# Patient Record
Sex: Male | Born: 1980
Health system: Southern US, Community
[De-identification: ages and names within clinical notes are randomized; demographics above are authoritative.]

## PROBLEM LIST (undated history)

## (undated) DIAGNOSIS — E559 Vitamin D deficiency, unspecified: Secondary | ICD-10-CM

## (undated) DIAGNOSIS — E7800 Pure hypercholesterolemia, unspecified: Secondary | ICD-10-CM

## (undated) DIAGNOSIS — F419 Anxiety disorder, unspecified: Secondary | ICD-10-CM

## (undated) HISTORY — DX: Pure hypercholesterolemia, unspecified: E78.00

## (undated) HISTORY — PX: APPENDECTOMY: SHX54

## (undated) HISTORY — PX: VASECTOMY: SHX75

## (undated) HISTORY — PX: CARDIAC SURGERY: SHX584

## (undated) HISTORY — DX: Vitamin D deficiency, unspecified: E55.9

---

## 2009-10-14 ENCOUNTER — Encounter: Admission: RE | Admit: 2009-10-14 | Discharge: 2009-10-14 | Payer: Self-pay | Admitting: Emergency Medicine

## 2012-04-30 ENCOUNTER — Ambulatory Visit (HOSPITAL_COMMUNITY)
Admission: RE | Admit: 2012-04-30 | Discharge: 2012-04-30 | Disposition: A | Discharge status: Home / Self Care | Payer: BC Managed Care – PPO | Source: Ambulatory Visit | Attending: Obstetrics and Gynecology | Admitting: Obstetrics and Gynecology

## 2012-04-30 DIAGNOSIS — Z31448 Encounter for other genetic testing of male for procreative management: Secondary | ICD-10-CM | POA: Insufficient documentation

## 2012-05-02 ENCOUNTER — Other Ambulatory Visit: Payer: Self-pay

## 2012-05-05 ENCOUNTER — Emergency Department (HOSPITAL_COMMUNITY)
Admission: EM | Admit: 2012-05-05 | Discharge: 2012-05-05 | Disposition: A | Discharge status: Home / Self Care | Payer: BC Managed Care – PPO | Attending: Emergency Medicine | Admitting: Emergency Medicine

## 2012-05-05 ENCOUNTER — Ambulatory Visit: Payer: BC Managed Care – PPO

## 2012-05-05 ENCOUNTER — Ambulatory Visit: Payer: BC Managed Care – PPO | Admitting: Emergency Medicine

## 2012-05-05 ENCOUNTER — Emergency Department (HOSPITAL_COMMUNITY): Payer: BC Managed Care – PPO

## 2012-05-05 ENCOUNTER — Encounter (HOSPITAL_COMMUNITY): Payer: Self-pay | Admitting: Emergency Medicine

## 2012-05-05 VITALS — BP 138/82 | HR 76 | Temp 97.9°F | Resp 18 | Ht 68.5 in | Wt 186.0 lb

## 2012-05-05 DIAGNOSIS — R209 Unspecified disturbances of skin sensation: Secondary | ICD-10-CM | POA: Insufficient documentation

## 2012-05-05 DIAGNOSIS — R079 Chest pain, unspecified: Secondary | ICD-10-CM

## 2012-05-05 DIAGNOSIS — Z8659 Personal history of other mental and behavioral disorders: Secondary | ICD-10-CM | POA: Insufficient documentation

## 2012-05-05 DIAGNOSIS — R071 Chest pain on breathing: Secondary | ICD-10-CM | POA: Insufficient documentation

## 2012-05-05 HISTORY — DX: Anxiety disorder, unspecified: F41.9

## 2012-05-05 LAB — BASIC METABOLIC PANEL
CO2: 28 mEq/L (ref 19–32)
Calcium: 9.3 mg/dL (ref 8.4–10.5)
Chloride: 105 mEq/L (ref 96–112)
Potassium: 3.9 mEq/L (ref 3.5–5.1)
Sodium: 141 mEq/L (ref 135–145)

## 2012-05-05 LAB — CBC
Platelets: 180 10*3/uL (ref 150–400)
RBC: 4.51 MIL/uL (ref 4.22–5.81)
WBC: 5.6 10*3/uL (ref 4.0–10.5)

## 2012-05-05 LAB — POCT I-STAT TROPONIN I: Troponin i, poc: 0 ng/mL (ref 0.00–0.08)

## 2012-05-05 IMAGING — CR DG CHEST 1V PORT
1 series · 1 of 1 positions shown · non-contrast
Comparison: Chest CT [DATE]

CLINICAL DATA: Chest pain.

PORTABLE CHEST - 1 VIEW

[AP]
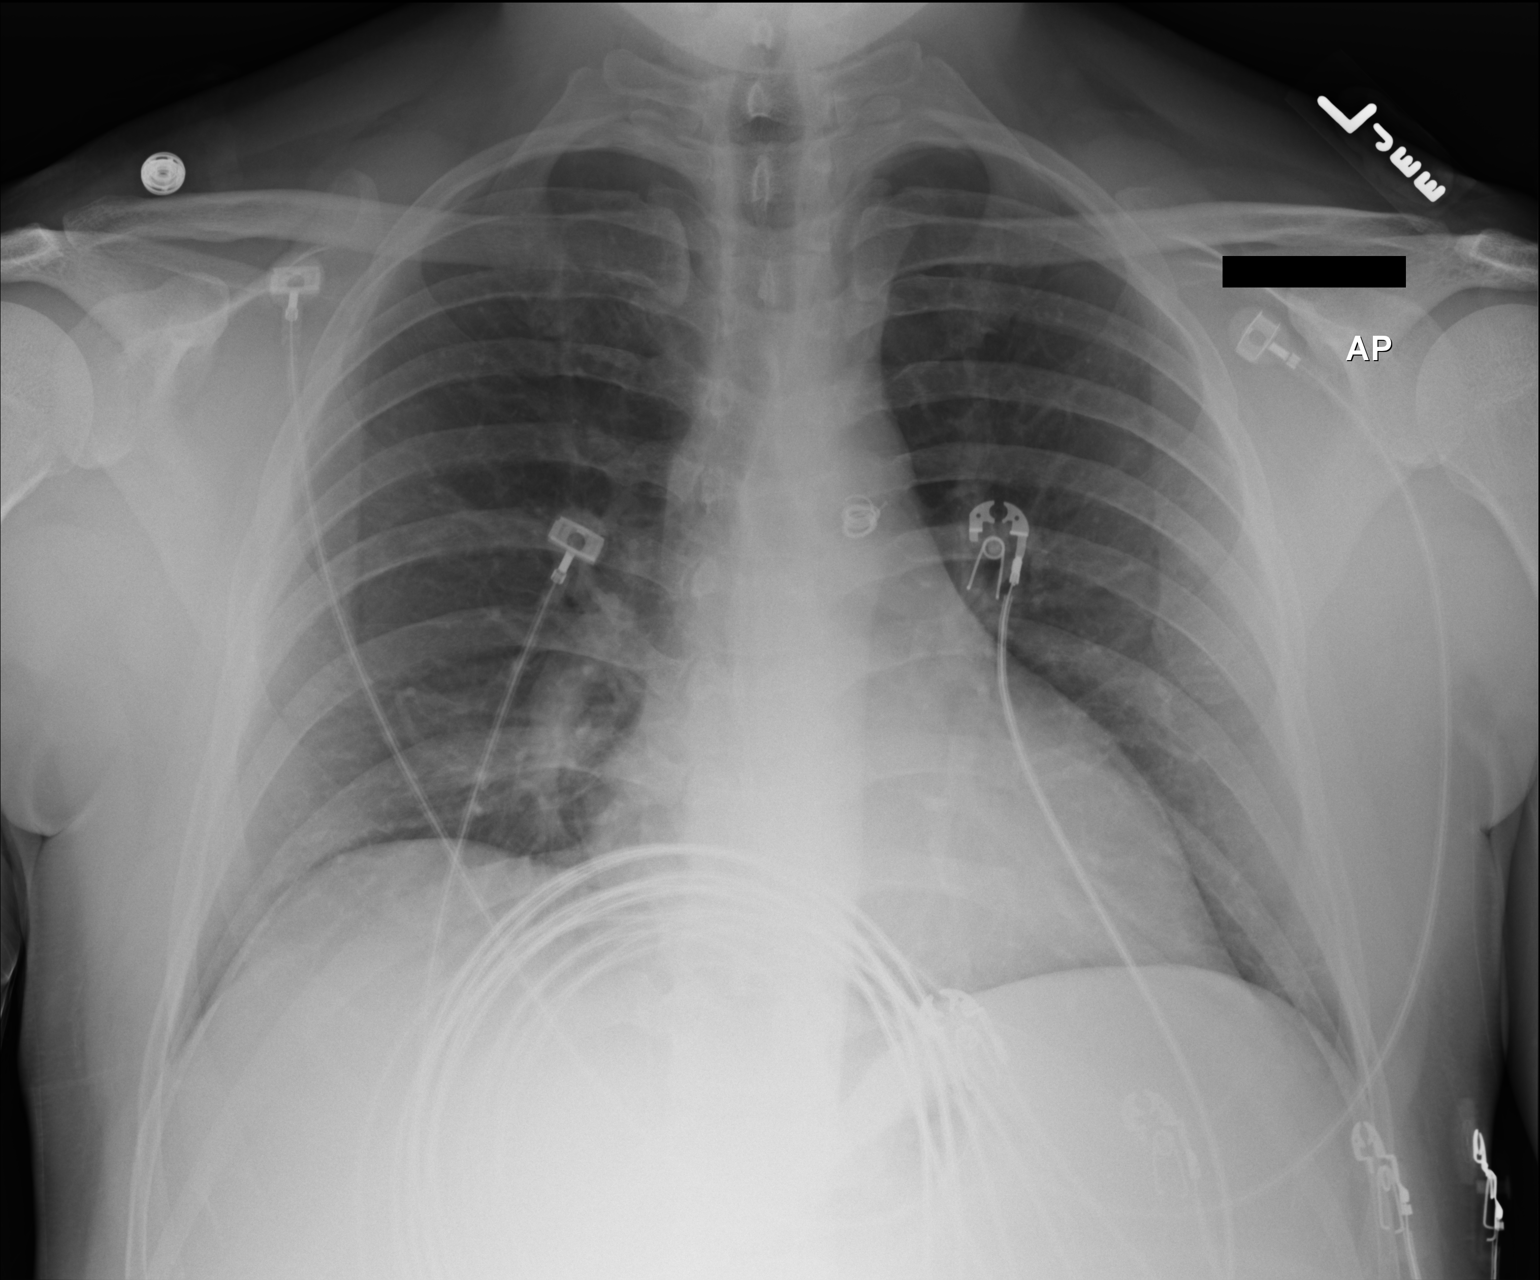

[1 of 1 positions shown; findings below may reference images not displayed]

FINDINGS: Single view of the chest demonstrates mild elevation of
the right hemidiaphragm which may be chronic. Heart and mediastinum
are within normal limits.  The lungs are clear without airspace
disease or edema.  Bony thorax is intact.
IMPRESSION: No acute chest findings.

## 2012-05-05 MED ORDER — NITROGLYCERIN 0.4 MG SL SUBL: 0.4000 mg | SUBLINGUAL_TABLET | Freq: Once | SUBLINGUAL | Status: DC

## 2012-05-05 MED ORDER — NITROGLYCERIN 0.3 MG SL SUBL
0.4000 mg | SUBLINGUAL_TABLET | Freq: Once | SUBLINGUAL | Status: AC
  Administered 2012-05-05: 0.3 mg via SUBLINGUAL

## 2012-05-05 NOTE — ED Notes (Signed)
PER EMS: pt from urgent care; pt c/o chest pain at urgent care; pt given 1 nitro and 324 ASA; pt denies chest pain at this time; BP 138/82 pulse 76 RR 16; 12 lead unremarkable; pt has hx of anxiety, pt has been off medication for anxiety for about 3 months. Pt c/o left arm numbness. Pt has function and pulse in left arm normal.

## 2012-05-05 NOTE — ED Notes (Signed)
Pt alert and mentating appropriately upon d/c. Pt given d/c teaching and follow up care instructions. Pt verbalizes understanding of d/c teaching. Pt has no further questions upon d/c. Pt ambulatory upon d/c leaving with wife. Pt leaving with d/c teaching and follow up care instructions.

## 2012-05-05 NOTE — ED Provider Notes (Signed)
History     CSN: [id number]  Arrival date & time 05/05/12  1415   First MD Initiated Contact with Patient 05/05/12 1445      Chief Complaint  Patient presents with  . Chest Pain    (Consider location/radiation/quality/duration/timing/severity/associated sxs/prior treatment) HPI  The patient is a 32 year old male past medical history significant for anxiety, status post PDA closure presenting from urgent care with sharp left-sided chest pain with radiation down left arm that began suddenly this afternoon while on the phone. Patient states pain lasts around 15 minutes with resolution chest pain and left-sided numbness without medication intervention. Patient denies any weakness or decreased function in the left arm during the 15 minute episode. No personal or familial cardiac history.   Past Medical History  Diagnosis Date  . Anxiety     Past Surgical History  Procedure Laterality Date  . Appendectomy    . Cardiac surgery      History reviewed. No pertinent family history.  History  Substance Use Topics  . Smoking status: Not on file  . Smokeless tobacco: Not on file  . Alcohol Use: Not on file      Review of Systems  Constitutional: Negative.   HENT: Negative.   Eyes: Negative.   Respiratory: Negative.   Cardiovascular: Positive for chest pain.  Gastrointestinal: Negative.   Genitourinary: Negative.   Musculoskeletal: Negative.   Skin: Negative.   Neurological: Positive for numbness. Negative for dizziness, speech difficulty, weakness, light-headedness and headaches.       Resolved    Allergies  Penicillins  Home Medications   Current Outpatient Rx  Name  Route  Sig  Dispense  Refill  . acetaminophen (TYLENOL) 325 MG tablet   Oral   Take 650 mg by mouth every 6 (six) hours as needed for pain.           BP 107/68  Pulse 68  Temp(Src) 98.4 F (36.9 C) (Oral)  Resp 18  SpO2 96%  Physical Exam  Constitutional: He is oriented to person, place,  and time. He appears well-developed and well-nourished.  HENT:  Head: Normocephalic and atraumatic.  Eyes: EOM are normal. Pupils are equal, round, and reactive to light.  Neck: Normal range of motion. Neck supple. Carotid bruit is not present.  Cardiovascular: Normal rate, regular rhythm and normal heart sounds.   Pulmonary/Chest: Effort normal and breath sounds normal.  Abdominal: Soft. Bowel sounds are normal.  Musculoskeletal: Normal range of motion. He exhibits no edema.  Neurological: He is alert and oriented to person, place, and time.  Skin: Skin is warm and dry.  Psychiatric: He has a normal mood and affect.    ED Course  Procedures (including critical care time)   Date: 05/05/2012  Rate: 64  Rhythm: normal sinus rhythm  QRS Axis: normal  Intervals: normal  ST/T Wave abnormalities: normal  Conduction Disutrbances:none  Narrative Interpretation:   Old EKG Reviewed: none available    Labs Reviewed  CBC  BASIC METABOLIC PANEL  POCT I-STAT TROPONIN I   Dg Chest Port 1 View  05/05/2012  *RADIOLOGY REPORT*  Clinical Data: Chest pain.  PORTABLE CHEST - 1 VIEW  Comparison: Chest CT 10/14/2009  Findings: Single view of the chest demonstrates mild elevation of the right hemidiaphragm which may be chronic. Heart and mediastinum are within normal limits.  The lungs are clear without airspace disease or edema.  Bony thorax is intact.  IMPRESSION: No acute chest findings.   Original Report Authenticated By:  Richarda Overlie, M.D.      1. Chest pain       MDM  Patient is to be discharged with recommendation to follow up with PCP in regards to today's hospital visit. Chest pain is not likely of cardiac or pulmonary etiology d/t presentation, perc negative, VSS, no tracheal deviation, no JVD or new murmur, RRR, breath sounds equal bilaterally, EKG without acute abnormalities, negative troponin, and negative CXR. Pt has been advised to return to the ED is CP becomes exertional,  associated with diaphoresis or nausea, radiates to left jaw/arm, worsens or becomes concerning in any way. Pt appears reliable for follow up and is agreeable to discharge.   Case has been discussed with and seen by Dr. Ignacia Palma who agrees with the above plan to discharge.          Jeannetta Ellis, PA-C 05/05/12 2104

## 2012-05-05 NOTE — Addendum Note (Signed)
Addended by: Sheldon Silvan on: 05/05/2012 01:48 PM   Modules accepted: Orders, Medications

## 2012-05-05 NOTE — Progress Notes (Signed)
[facility] [address] [place] [address] [phone]  Date:  05/05/2012   Name:  Adam Herrera   DOB:  July 19, 1980   MRN:  [id number]  PCP:  No primary provider on file.    Chief Complaint: Chest Pain   History of Present Illness:  Adam Herrera is a 32 y.o. very pleasant male patient who presents with the following:  Sitting in car talking to mother in a heated discussion.  Developed chest pain into his left arm that lasted 15 minutes.  Developed lightheadedness while having chest pain. t   Non smoker.  No history or hypertension, DM, elevated cholesterol.  No antecedent illness or injury. Or overuse.  No meds.  Uncle died of premature cardiac disease. No shortness of breath, nausea or vomiting.  No palpitations or tachycardia.  Says he has never had pain in his chest previously.  History of repaired PDA that was an issue when he developed shortness of breath.  At time of exam still had some numbness in his left arm.  There are no active problems to display for this patient.   No past medical history on file.  No past surgical history on file.  History  Substance Use Topics  . Smoking status: Not on file  . Smokeless tobacco: Not on file  . Alcohol Use: Not on file    No family history on file.  Allergies  Allergen Reactions  . Penicillins     Medication list has been reviewed and updated.  No current outpatient prescriptions on file prior to visit.   No current facility-administered medications on file prior to visit.    Review of Systems:  As per HPI, otherwise negative.    Physical Examination: Filed Vitals:   05/05/12 1318  BP: 138/82  Pulse: 76  Temp: 97.9 F (36.6 C)  Resp: 18   Filed Vitals:   05/05/12 1318  Height: 5' 8.5" (1.74 m)  Weight: 186 lb (84.369 kg)   Body mass index is 27.87 kg/(m^2). Ideal Body Weight: Weight in (lb) to have BMI = 25: 166.5  GEN: WDWN, NAD, Non-toxic, A & O x 3 HEENT:  Atraumatic, Normocephalic. Neck supple. No masses, No LAD. Ears and Nose: No external deformity. CV: RRR, No M/G/R. No JVD. No thrill. No extra heart sounds. PULM: CTA B, no wheezes, crackles, rhonchi. No retractions. No resp. distress. No accessory muscle use. ABD: S, NT, ND, +BS. No rebound. No HSM. EXTR: No c/c/e NEURO Normal gait.  PSYCH: Normally interactive. Conversant. Not depressed or anxious appearing.  Calm demeanor.   Assessment and Plan: Chest pain  EKG unremarkable. To ER with EMS ASA NTG   Signed,  Phillips Odor, MD

## 2012-05-05 NOTE — Addendum Note (Signed)
Addended by: Sheldon Silvan on: 05/05/2012 01:46 PM   Modules accepted: Orders

## 2012-05-06 ENCOUNTER — Telehealth: Payer: Self-pay

## 2012-05-06 NOTE — Telephone Encounter (Signed)
To you FYI  

## 2012-05-06 NOTE — ED Provider Notes (Signed)
Medical screening examination/treatment/procedure(s) were conducted as a shared visit with non-physician practitioner(s) and myself.  I personally evaluated the patient during the encounter Pt was in a heated discussion --> developed chest pain lasting about 15 minutes.  No prior Hx chest pain or heart disease.  PE benign.  Lab workup negative for CAD.  Advised and released.  Carleene Cooper III, MD 05/06/12 [phone]

## 2012-05-06 NOTE — Telephone Encounter (Signed)
Called him advised him to return to clinic for this.

## 2012-05-06 NOTE — Telephone Encounter (Signed)
Patient would like a referral for his anxiety if possible [phone]

## 2014-12-28 ENCOUNTER — Ambulatory Visit (INDEPENDENT_AMBULATORY_CARE_PROVIDER_SITE_OTHER): Payer: BLUE CROSS/BLUE SHIELD | Admitting: Family Medicine

## 2014-12-28 VITALS — BP 130/84 | HR 90 | Temp 98.0°F | Resp 20 | Ht 68.0 in | Wt 197.6 lb

## 2014-12-28 DIAGNOSIS — J988 Other specified respiratory disorders: Secondary | ICD-10-CM

## 2014-12-28 DIAGNOSIS — J22 Unspecified acute lower respiratory infection: Secondary | ICD-10-CM

## 2014-12-28 MED ORDER — AZITHROMYCIN 250 MG PO TABS: ORAL_TABLET | ORAL | Status: DC

## 2014-12-28 NOTE — Progress Notes (Signed)
Patient ID: Adam BeathJohnathan T Herrera, male    DOB: [birth year]  Age: 34 y.o. MRN: [id number]  Chief Complaint  Patient presents with  . Sore Throat    last week  . Wheezing    Subjective:   34 year old man who is here with a respiratory tract infection. He started with a sore throat about 9 days ago. His daughter was diagnosed with strep. He said for about 3 or 4 days he had a cutting like pain in his throat. Then it settled down lower and gradually subsided until Sunday got hurting again and now has a sensation of congestion and needing to clear his throat. It is not down in his chest and coughing as much is up in the lower part of the neck that he has symptoms. He does not smoke. His father 2 little children, and there is been sickness among them and his wife. 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 He does live here in [place]. Otherwise he is healthy and not on any major medications.  Current allergies, medications, problem list, past/family and social histories reviewed.  Objective:  BP 130/84 mmHg  Pulse 90  Temp(Src) 98 F (36.7 C) (Oral)  Resp 20  Ht 5\' 8"  (1.727 m)  Wt 197 lb 9.6 oz (89.631 kg)  BMI 30.05 kg/m2  SpO2 98%  No major acute distress. TMs normal. Throat clear. Neck has small anterior and posterior cervical nodes. Chest is clear to auscultation except 1 [place] us. Heart regular without murmurs.  Assessment & Plan:   Assessment: 1. Lower resp. tract infection       Plan: Treat for the lower respiratory infection with azithromycin and Mucinex. Return if worse. No labs or x-rays today.  No orders of the defined types were placed in this encounter.    Meds ordered this encounter  Medications  . azithromycin (ZITHROMAX) 250 MG tablet    Sig: Take 2 tabs PO x 1 dose, then 1 tab PO QD x 4 days    Dispense:  6 tablet    Refill:  0         Patient Instructions  Drink plenty of fluids and get enough rest  Take over-the-counter Mucinex to  thin the secretions. Since you're not coughing a lot I would recommend just using the plain Mucinex  If you develop a lot of head congestion go ahead and take something like Claritin-D (loratadine D) or Zyrtec-D (cetirizine D) for the congestion.  Take azithromycin 2 pills initially, then 1 daily for 4 days for infection.  Return if not improving or if worse at anytime     Return if symptoms worsen or fail to improve.   Chenee Munns, MD 12/28/2014

## 2014-12-28 NOTE — Patient Instructions (Signed)
Drink plenty of fluids and get enough rest  Take over-the-counter Mucinex to thin the secretions. Since you're not coughing a lot I would recommend just using the plain Mucinex  If you develop a lot of head congestion go ahead and take something like Claritin-D (loratadine D) or Zyrtec-D (cetirizine D) for the congestion.  Take azithromycin 2 pills initially, then 1 daily for 4 days for infection.  Return if not improving or if worse at anytime

## 2016-09-04 ENCOUNTER — Emergency Department (HOSPITAL_COMMUNITY)
Admission: EM | Admit: 2016-09-04 | Discharge: 2016-09-05 | Disposition: A | Discharge status: Home / Self Care | Payer: BLUE CROSS/BLUE SHIELD | Attending: Emergency Medicine | Admitting: Emergency Medicine

## 2016-09-04 ENCOUNTER — Encounter (HOSPITAL_COMMUNITY): Payer: Self-pay | Admitting: Emergency Medicine

## 2016-09-04 DIAGNOSIS — R51 Headache: Secondary | ICD-10-CM | POA: Insufficient documentation

## 2016-09-04 DIAGNOSIS — Z79899 Other long term (current) drug therapy: Secondary | ICD-10-CM | POA: Insufficient documentation

## 2016-09-04 DIAGNOSIS — R519 Headache, unspecified: Secondary | ICD-10-CM

## 2016-09-04 MED ORDER — DIPHENHYDRAMINE HCL 50 MG/ML IJ SOLN
50.0000 mg | Freq: Once | INTRAMUSCULAR | Status: AC
  Administered 2016-09-05: 50 mg via INTRAVENOUS
  Filled 2016-09-04: qty 1

## 2016-09-04 MED ORDER — KETOROLAC TROMETHAMINE 30 MG/ML IJ SOLN
15.0000 mg | Freq: Once | INTRAMUSCULAR | Status: AC
  Administered 2016-09-05: 15 mg via INTRAVENOUS
  Filled 2016-09-04: qty 1

## 2016-09-04 MED ORDER — LIDOCAINE-EPINEPHRINE (PF) 2 %-1:200000 IJ SOLN
10.0000 mL | Freq: Once | INTRAMUSCULAR | Status: AC
  Administered 2016-09-05: 10 mL via INTRADERMAL
  Filled 2016-09-04: qty 20

## 2016-09-04 MED ORDER — METOCLOPRAMIDE HCL 5 MG/ML IJ SOLN
10.0000 mg | Freq: Once | INTRAMUSCULAR | Status: AC
  Administered 2016-09-05: 10 mg via INTRAVENOUS
  Filled 2016-09-04: qty 2

## 2016-09-04 NOTE — ED Triage Notes (Signed)
Pt here for HA x 4 days on back of head and neck; pt sts had CT scan today which was normal and sent for LP

## 2016-09-05 LAB — CSF CELL COUNT WITH DIFFERENTIAL
RBC COUNT CSF: 16 /mm3 — AB
RBC Count, CSF: 2 /mm3 — ABNORMAL HIGH
TUBE #: 4
Tube #: 1
WBC CSF: 2 /mm3 (ref 0–5)
WBC, CSF: 3 /mm3 (ref 0–5)

## 2016-09-05 LAB — GLUCOSE, CSF: GLUCOSE CSF: 54 mg/dL (ref 40–70)

## 2016-09-05 LAB — PROTEIN, CSF: Total  Protein, CSF: 48 mg/dL — ABNORMAL HIGH (ref 15–45)

## 2016-09-05 NOTE — ED Provider Notes (Signed)
[facility]PT Provider Note   CSN: [id number] Arrival date & time: 09/04/16  1807     History   Chief Complaint Chief Complaint  Patient presents with  . Headache    HPI Adam Herrera is a 36 y.o. male. With no sig PMH, here with a headache for 3 days. Patient States this first started when he bent over to pick something up. He had a sudden onset acute headache in the back of his neck. This came on suddenly. Since that time he has continued to have headaches in this area which radiates forward to his head. He does have history of migraines but states this feels completely different. Last night he had sexual intercourse with his wife and upon ejaculation he states that severe pain came back once again. He has been taking ibuprofen with minimal relief. His primary care doctor ordered a CT scan which was negative and advised him to come to emergency department for lumbar puncture. He denies any neurological symptoms. He recently had a left lower lobe pneumonia and is currently on doxycycline for this. There are no further complaints.  10 Systems reviewed and are negative for acute change except as noted in the HPI.   HPI  Past Medical History:  Diagnosis Date  . Anxiety     There are no active problems to display for this patient.   Past Surgical History:  Procedure Laterality Date  . APPENDECTOMY    . CARDIAC SURGERY         Home Medications    Prior to Admission medications   Medication Sig Start Date End Date Taking? Authorizing Provider  doxycycline (VIBRA-TABS) 100 MG tablet Take 100 mg by mouth daily.   Yes [provider]  ibuprofen (ADVIL,MOTRIN) 200 MG tablet Take 200 mg by mouth every 6 (six) hours as needed for fever, headache or mild pain.   Yes [provider]  azithromycin (ZITHROMAX) 250 MG tablet Take 2 tabs PO x 1 dose, then 1 tab PO QD x 4 days Patient not taking: Reported on 09/05/2016 12/28/14   Peyton Najjar, MD     Family History History reviewed. No pertinent family history.  Social History Social History  Substance Use Topics  . Smoking status: Never Smoker  . Smokeless tobacco: Not on file  . Alcohol use No     Allergies   Penicillins   Review of Systems Review of Systems   Physical Exam Updated Vital Signs BP 119/79   Pulse (!) 44   Temp 98.1 F (36.7 C) (Oral)   Resp 16   Ht 5\' 8"  (1.727 m)   Wt 77.1 kg (170 lb)   SpO2 97%   BMI 25.85 kg/m   Physical Exam  Constitutional: He is oriented to person, place, and time. Vital signs are normal. He appears well-developed and well-nourished.  Non-toxic appearance. He does not appear ill. No distress.  HENT:  Head: Normocephalic and atraumatic.  Nose: Nose normal.  Mouth/Throat: Oropharynx is clear and moist. No oropharyngeal exudate.  Eyes: Pupils are equal, round, and reactive to light. Conjunctivae and EOM are normal. No scleral icterus.  Neck: Normal range of motion. Neck supple. No tracheal deviation, no edema, no erythema and normal range of motion present. No thyroid mass and no thyromegaly present.  Cardiovascular: Normal rate, regular rhythm, S1 normal, S2 normal, normal heart sounds, intact distal pulses and normal pulses.  Exam reveals no gallop and no friction rub.   No murmur heard. Pulmonary/Chest:  Effort normal and breath sounds normal. No respiratory distress. He has no wheezes. He has no rhonchi. He has no rales.  Abdominal: Soft. Normal appearance and bowel sounds are normal. He exhibits no distension, no ascites and no mass. There is no hepatosplenomegaly. There is no tenderness. There is no rebound, no guarding and no CVA tenderness.  Musculoskeletal: Normal range of motion. He exhibits no edema or tenderness.  Lymphadenopathy:    He has no cervical adenopathy.  Neurological: He is alert and oriented to person, place, and time. He has normal strength. No cranial nerve deficit or sensory deficit.  Normal  strength and sensation in all extremities. Normal cerebellar testing.  Skin: Skin is warm, dry and intact. No petechiae and no rash noted. He is not diaphoretic. No erythema. No pallor.  Nursing note and vitals reviewed.    ED Treatments / Results  Labs (all labs ordered are listed, but only abnormal results are displayed) Labs Reviewed  PROTEIN, CSF - Abnormal; Notable for the following:       Result Value   Total  Protein, CSF 48 (*)    All other components within normal limits  CSF CELL COUNT WITH DIFFERENTIAL - Abnormal; Notable for the following:    RBC Count, CSF 16 (*)    All other components within normal limits  CSF CELL COUNT WITH DIFFERENTIAL - Abnormal; Notable for the following:    RBC Count, CSF 2 (*)    All other components within normal limits  CSF CULTURE  GRAM STAIN  GLUCOSE, CSF  HERPES SIMPLEX VIRUS(HSV) DNA BY PCR    EKG  EKG Interpretation None       Radiology No results found.  Procedures .Lumbar Puncture Date/Time: 09/05/2016 12:58 AM Performed by: Tomasita CrumbleNI, Cortez Flippen Authorized by: Tomasita CrumbleNI, Triston Lisanti   Consent:    Consent obtained:  Written and verbal   Consent given by:  Patient   Risks discussed:  Infection, pain, nerve damage, headache and bleeding   Alternatives discussed:  No treatment Pre-procedure details:    Procedure purpose:  Diagnostic   Preparation: Patient was prepped and draped in usual sterile fashion   Anesthesia (see MAR for exact dosages):    Anesthesia method:  Local infiltration   Local anesthetic:  Lidocaine 2% WITH epi Procedure details:    Lumbar space:  L4-L5 interspace   Patient position:  R lateral decubitus   Needle gauge:  22   Needle type:  Spinal needle - Quincke tip   Needle length (in):  2.5   Ultrasound guidance: no     Number of attempts:  1   Opening pressure (cm H2O):  21   Fluid appearance:  Clear   Tubes of fluid:  4   Total volume (ml):  4 Post-procedure:    Puncture site:  Adhesive bandage applied and  direct pressure applied   Patient tolerance of procedure:  Tolerated well, no immediate complications   (including critical care time)  Medications Ordered in ED Medications  lidocaine-EPINEPHrine (XYLOCAINE W/EPI) 2 %-1:200000 (PF) injection 10 mL (10 mLs Intradermal Given 09/05/16 0032)  ketorolac (TORADOL) 30 MG/ML injection 15 mg (15 mg Intravenous Given 09/05/16 0032)  diphenhydrAMINE (BENADRYL) injection 50 mg (50 mg Intravenous Given 09/05/16 0031)  metoCLOPramide (REGLAN) injection 10 mg (10 mg Intravenous Given 09/05/16 0032)     Initial Impression / Assessment and Plan / ED Course  I have reviewed the triage vital signs and the nursing notes.  Pertinent labs & imaging results that were  available during my care of the patient were reviewed by me and considered in my medical decision making (see chart for details).     Patient presents to the emergency department for sudden onset acute headache. History is concerning for an emergent intracranial abnormality. Vital signs are currently normal. CT scans are negative per history, no need to repeat.  Will obtain lumbar puncture. He was given Toradol, Benadryl and Reglan for his headache.    2:53 AM lumbar puncture is negative for subarachnoid hemorrhage or meningitis. This is explained to the patient he demonstrates good understanding. Advised to go back to his primary care doctor for further care. After medication given, headache has completely resolved. He completely feels back to normal. This may be worsening of his normal chronic headaches. He appears well and in no acute distress, vital signs remain within his normal limits and he is safe for discharge. Primary care follow-up advised in 3 days.   Final Clinical Impressions(s) / ED Diagnoses   Final diagnoses:  None    New Prescriptions New Prescriptions   No medications on file     Tomasita Crumble, MD 09/05/16 [phone]

## 2016-09-05 NOTE — ED Notes (Signed)
Pt departed in NAD, refused use of wheelchair.  

## 2016-09-06 LAB — HERPES SIMPLEX VIRUS(HSV) DNA BY PCR
HSV 1 DNA: NEGATIVE
HSV 2 DNA: NEGATIVE

## 2016-09-08 LAB — CSF CULTURE: CULTURE: NO GROWTH

## 2016-09-08 LAB — CSF CULTURE W GRAM STAIN

## 2017-10-15 ENCOUNTER — Encounter: Payer: Self-pay | Admitting: Psychiatry

## 2017-10-15 ENCOUNTER — Ambulatory Visit (INDEPENDENT_AMBULATORY_CARE_PROVIDER_SITE_OTHER): Payer: BLUE CROSS/BLUE SHIELD | Admitting: Psychiatry

## 2017-10-15 VITALS — BP 118/77 | HR 54 | Ht 68.0 in

## 2017-10-15 DIAGNOSIS — F411 Generalized anxiety disorder: Secondary | ICD-10-CM | POA: Diagnosis not present

## 2017-10-15 MED ORDER — SERTRALINE HCL 50 MG PO TABS: ORAL_TABLET | ORAL | 1 refills | Status: DC

## 2017-10-15 NOTE — Patient Instructions (Signed)
Take 1/2 tablet daily for one week, then 1 tablet daily for one week, then 1.5 tablets daily. Please call office with any questions or intolerable side effects.

## 2017-10-15 NOTE — Progress Notes (Signed)
[facility] MD/PA/NP Initial Note  10/15/2017 5:58 PM Adam Herrera  MRN:  [id number]  Chief Complaint:  Chief Complaint    Anxiety     HPI: He reports that he has been seen by Ulice Bold, LPC. "I should be on medication but I don't like medication." He reports "I know I have a little bit of OCD" and cannot tolerate clutter. For example, reports that he has severe anxiety on Christmas morning because of the clutter and wanting to clean up immediately. Denies checking behaviors. Denies rituals or routines. Denies compulsive counting or grouping.  Reports long-standing anxiety and worry. Reports that he sometimes has physical s/s of anxiety with muscle tension. Worry will also prevent him from being able to fall asleep. Reports that he will also worry about how people perceive him. Sometimes feels that others are judging him, even though he knows that this is unlikely. Reports that he often does not initiate social interaction. 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 Has only had a few panic attacks since he left management position.  Reports wanting things to be in a certain order or way. Reports that he is impatient and easily angered. "Just anything will set me off... I say mean things." Reports anger and frustration often result from anxiety. Reports that he often has a fear that others will leave him. Denies exaggerated startle response or hypervigilance.  "I feel down about things and depressed." Reports experiencing frequent irritability. Has lost 25 lbs intentionally with diet and exercise. Reports that he gained weight after their daughter was born and would eat "because I was nervous." Exercises 4-5 times a week. Reports that sometimes after exercising "I'm more amped up." No change in appetite. Occasional difficulty with falling asleep when he is more stressed and worrying about something. He reports sleeping about 8 hours a  night. Reports energy level depends on what he is eating. Motivation is adequate. Denies impaired concentration. Denies SI.  Denies periods of decreased need for sleep. Reports bursts of energy with severe anger and reports that this happens for brief periods. Denies periods of elevated mood. Denies h/o risky or impulsive behavior. Reports excessive spending when he first divorced and reports that this was because his mood was lower.  Reports h/p depressive episodes in his lifetime, to include after his first marriage ended. Reports that he has had periods of depression when family members have let him down.    Additional Social History: Born in [place] and moved to [place] when he was 1 yo. Mother got married when she was 72 and had pt soon after he got married. Biological father was hit by an MVA when pt was 1 yo. Reports that mother started dating step-father when pt was around 50 and married when he was 37 yo. Has a younger sister (mother was pregnant with her when pt's father was killed). Mom and step-father moved to [facility] when he was 37 yo and he chose to stay in [place]. Reports that relationship with mother and step-father has improved in adulthood. Reports that step-father was "always good to [place]." Reports that he lived with his grandparents and that grandfather was angry and would highly control his behavior, ie. Told him he was flushing the toilet too much and making the water bill go up. Worked at [employer] x 21 years. 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 Reports that  his wife Herbert Seta has an 44 yo daughter from her previous marriage. Reports that he and Herbert Seta have a 83 yo daughter, Charlotte Sanes. Wife is supportive. Talks to mother almost daily. Reports that he has few friends in [place]. Enjoys watching or playing sports.  Married and divorced from first wife due to them having different values. Visit Diagnosis:    ICD-10-CM   1. Generalized anxiety disorder  F41.1     Past Psychiatric History:  Has seen Ulice Bold, [facility]. Also saw a therapist after his divorce in [place]. Has been prescribed SSRI's through PCP/urgent care. Denies past psychiatric hospitalizations.   Seroquel- Reports vivid dreams and nightmares with perceptual disturbances. Helped stabilize mood. Lexapro- Wt gain, sexual side effects. Helped with anxiety. Reports initially felt very calm, "like a zombie."  Paxil- Ineffective  Past Medical History:  Past Medical History:  Diagnosis Date  . Anxiety     Past Surgical History:  Procedure Laterality Date  . APPENDECTOMY    . CARDIAC SURGERY      Family Psychiatric History: See below family hx   Family History:  Family History  Problem Relation Age of Onset  . Anxiety disorder Mother   . Depression Mother   . Alcohol abuse Paternal Grandfather   . Heart attack Paternal Grandfather   . Anxiety disorder Maternal Aunt   . Anxiety disorder Maternal Uncle     Social History:  Social History   Socioeconomic History  . Marital status: Married    Spouse name: Not on file  . Number of children: Not on file  . Years of education: Not on file  . Highest education level: Not on file  Occupational History  . Not on file  Social Needs  . Financial resource strain: Not on file  . Food insecurity:    Worry: Not on file    Inability: Not on file  . Transportation needs:    Medical: Not on file    Non-medical: Not on file  Tobacco Use  . Smoking status: Never Smoker  . Smokeless tobacco: Never Used  Substance and Sexual Activity  . Alcohol use: Yes    Comment: Occasional social ETOH use on the weekends  . Drug use: No  . Sexual activity: Not on file  Lifestyle  . Physical activity:    Days per week: Not on file    Minutes per session: Not on file  . Stress: Not on file  Relationships  . Social connections:    Talks on phone: Not on file    Gets together: Not on file    Attends religious service: Not on file     Active member of club or organization: Not on file    Attends meetings of clubs or organizations: Not on file    Relationship status: Not on file  Other Topics Concern  . Not on file  Social History Narrative  . Not on file    Allergies:  Allergies  Allergen Reactions  . Doxycycline     Severe headache  . Penicillins Rash    Metabolic Disorder Labs: No results found for: HGBA1C, MPG No results found for: PROLACTIN No results found for: CHOL, TRIG, HDL, CHOLHDL, VLDL, LDLCALC No results found for: TSH  Therapeutic Level Labs: No results found for: LITHIUM No results found for: VALPROATE No components found for:  CBMZ  Current Medications: Current Outpatient Medications  Medication Sig Dispense Refill  . sertraline (ZOLOFT) 50 MG tablet Take 1/2 tab po qd x 1  week, then 1 tab po qd x 1 week, then 1.5 tabs po qd 45 tablet 1  . valACYclovir (VALTREX) 1000 MG tablet valacyclovir 1 gram tablet     No current facility-administered medications for this visit.    Medication Side Effects: Other: N/A  Orders placed this visit:  No orders of the defined types were placed in this encounter.   Psychiatric Specialty Exam: Review of Systems  Constitutional: Negative.   HENT: Negative.   Eyes: Negative.   Respiratory: Negative.   Gastrointestinal: Negative.   Musculoskeletal: Negative.   Skin: Negative.   Neurological: Negative.     Blood pressure 118/77, pulse (!) 54, height 5\' 8"  (1.727 m).Body mass index is 25.85 kg/m.  General Appearance: Casual, Meticulous, Neat and Well Groomed  Eye Contact:  Good  Speech:  Normal Rate  Volume:  Normal  Mood:  Anxious and Depressed  Affect:  Anxious  Thought Process:  Coherent  Orientation:  Full (Time, Place, and Person)  Thought Content: Logical   Suicidal Thoughts:  No  Homicidal Thoughts:  No  Memory:  Immediate  Judgement:  Good  Insight:  Good  Psychomotor Activity:  Normal  Concentration:  Concentration: Good   Recall:  Good  Fund of Knowledge: Good  Language: Good  Akathisia:  NA  AIMS (if indicated): not done  Assets:  Communication Skills Desire for Improvement Social Support  ADL's:  Intact  Cognition: WNL  Prognosis:  Good   Screenings:  PHQ2-9     Office Visit from 12/28/2014 in Primary [facility] at [facility] Total Score  0       Treatment Plan/Recommendations: Patient seen for 60 minutes and greater than 50% of visit spent counseling patient regarding mood and anxiety signs and symptoms.  Also discussed potential benefits, risk, and side effects of sertraline.  Discussed option of starting with lower dose and titrating as needed considering patient's past history of side effects.  Discussed that the risk of sexual side effects increases with higher doses.  Patient reports that he would prefer to gradually initiate and titrate medication.  Will start with sertraline 25 mg daily for 1 week, then increase to 50 mg daily for 1 week, then increase to 75 mg daily for mood and anxiety.  Patient advised to contact office if he has questions or experiences any intolerable side effects.  Patient to follow-up with this provider in 6 weeks or sooner if clinically indicated.   Corie Chiquito, PMHNP

## 2017-11-22 ENCOUNTER — Encounter: Payer: Self-pay | Admitting: Psychiatry

## 2017-11-22 ENCOUNTER — Ambulatory Visit (INDEPENDENT_AMBULATORY_CARE_PROVIDER_SITE_OTHER): Payer: BLUE CROSS/BLUE SHIELD | Admitting: Psychiatry

## 2017-11-22 VITALS — BP 128/80 | HR 54

## 2017-11-22 DIAGNOSIS — F411 Generalized anxiety disorder: Secondary | ICD-10-CM | POA: Diagnosis not present

## 2017-11-22 MED ORDER — SERTRALINE HCL 100 MG PO TABS: ORAL_TABLET | ORAL | 0 refills | Status: DC

## 2017-11-22 NOTE — Progress Notes (Signed)
Adam CollumJohnathon T Herrera [id number] [birth year] 37 y.o.  Subjective:   Patient ID:  Adam Herrera is a 37 y.o. (DOB [birth year]) male.  Chief Complaint:  Chief Complaint  Patient presents with  . Follow-up    Anxiety, depression    HPI Adam Herrera presents to the office today for follow-up of anxiety. He reports that he is "a little bit better" and that he has noticed decreased worry and is slower to get frustrated and irritated. He reports that he increased Sertraline to 100 mg po qd about 1.5 weeks ago on his own since he noticed a partial improvement in anxiety with Sertraline. Reports having more patience at home and at work. He reports that he was calmer when they recently had a family gathering and was not as stressed about getting the house ready. Denies any recent panic attacks.Denies any recent episodes of saying things in anger that he regretted. He denies depressed mood. He reports that energy and motivation have improved. Denies difficulty with concentration. Appetite is stable. Reports that he initially had difficulty falling asleep with Sertraline. He reports that his sleep has improved and now sleeping 7-8 hours a night. Denies SI.   Review of Systems:  Review of Systems  Musculoskeletal: Negative for gait problem.  Neurological: Negative for tremors.  Psychiatric/Behavioral:       Please refer to HPI    Medications: I have reviewed the patient's current medications.  Current Outpatient Medications  Medication Sig Dispense Refill  . valACYclovir (VALTREX) 1000 MG tablet valacyclovir 1 gram tablet    . sertraline (ZOLOFT) 100 MG tablet Take 1-1.5 tabs po qd 135 tablet 0   No current facility-administered medications for this visit.     Medication Side Effects: Other: He had HA and sleep disturbance with starting and increasing Sertraline and it resolved  Allergies:  Allergies  Allergen Reactions  . Doxycycline     Severe headache  . Penicillins Rash     Past Medical History:  Diagnosis Date  . Anxiety     Family History  Problem Relation Age of Onset  . Anxiety disorder Mother   . Depression Mother   . Alcohol abuse Paternal Grandfather   . Heart attack Paternal Grandfather   . Anxiety disorder Maternal Aunt   . Anxiety disorder Maternal Uncle     Social History   Socioeconomic History  . Marital status: Married    Spouse name: Not on file  . Number of children: Not on file  . Years of education: Not on file  . Highest education level: Not on file  Occupational History  . Not on file  Social Needs  . Financial resource strain: Not on file  . Food insecurity:    Worry: Not on file    Inability: Not on file  . Transportation needs:    Medical: Not on file    Non-medical: Not on file  Tobacco Use  . Smoking status: Never Smoker  . Smokeless tobacco: Never Used  Substance and Sexual Activity  . Alcohol use: Yes    Comment: Occasional social ETOH use on the weekends  . Drug use: No  . Sexual activity: Not on file  Lifestyle  . Physical activity:    Days per week: Not on file    Minutes per session: Not on file  . Stress: Not on file  Relationships  . Social connections:    Talks on phone: Not on file    Gets together: Not on file  Attends religious service: Not on file    Active member of club or organization: Not on file    Attends meetings of clubs or organizations: Not on file    Relationship status: Not on file  . Intimate partner violence:    Fear of current or ex partner: Not on file    Emotionally abused: Not on file    Physically abused: Not on file    Forced sexual activity: Not on file  Other Topics Concern  . Not on file  Social History Narrative  . Not on file    Past Medical History, Surgical history, Social history, and Family history were reviewed and updated as appropriate.   Please see review of systems for further details on the patient's review from today.   Objective:    Physical Exam:  BP 128/80   Pulse (!) 54   Physical Exam  Constitutional: He is oriented to person, place, and time. He appears well-developed. No distress.  Musculoskeletal: He exhibits no deformity.  Neurological: He is alert and oriented to person, place, and time. Coordination normal.  Psychiatric: His speech is normal and behavior is normal. Judgment and thought content normal. His affect is not angry, not blunt, not labile and not inappropriate. Cognition and memory are normal. He does not exhibit a depressed mood. He expresses no homicidal and no suicidal ideation. He expresses no suicidal plans and no homicidal plans.  Presents as less anxious compared to initial exam. intact. No auditory or visual hallucinations. No delusions.     Lab Review:     Component Value Date/Time   NA 141 05/05/2012 1528   K 3.9 05/05/2012 1528   CL 105 05/05/2012 1528   CO2 28 05/05/2012 1528   GLUCOSE 92 05/05/2012 1528   BUN 12 05/05/2012 1528   CREATININE 0.89 05/05/2012 1528   CALCIUM 9.3 05/05/2012 1528   GFRNONAA >90 05/05/2012 1528   GFRAA >90 05/05/2012 1528       Component Value Date/Time   WBC 5.6 05/05/2012 1528   RBC 4.51 05/05/2012 1528   HGB 13.7 05/05/2012 1528   HCT 39.3 05/05/2012 1528   PLT 180 05/05/2012 1528   MCV 87.1 05/05/2012 1528   MCH 30.4 05/05/2012 1528   MCHC 34.9 05/05/2012 1528   RDW 12.8 05/05/2012 1528    No results found for: POCLITH, LITHIUM   No results found for: PHENYTOIN, PHENOBARB, VALPROATE, CBMZ   .res Assessment: Plan:   Pt seen for 20 minutes and greater than 50% of session spent counseling pt re: potential benefits, risks, and side effects of increasing Sertraline. Recommended continuing Sertraline 100 mg po qd for at least 4 weeks to determine response to 100 mg dosage and then may increase to 150 mg dose if mood and anxiety s/s are not fully controlled. Advised pt to call office with any questions or concerns and he agrees to do so.   Generalized anxiety disorder - Plan: sertraline (ZOLOFT) 100 MG tablet  Please see After Visit Summary for patient specific instructions.  Future Appointments  Date Time Provider Department Center  01/31/2018 10:00 AM Corie Chiquito, PMHNP [facility] None    No orders of the defined types were placed in this encounter.     -------------------------------

## 2018-01-31 ENCOUNTER — Ambulatory Visit: Payer: BLUE CROSS/BLUE SHIELD | Admitting: Psychiatry

## 2018-01-31 ENCOUNTER — Encounter: Payer: Self-pay | Admitting: Psychiatry

## 2018-01-31 VITALS — BP 119/77 | HR 57

## 2018-01-31 DIAGNOSIS — F5101 Primary insomnia: Secondary | ICD-10-CM | POA: Diagnosis not present

## 2018-01-31 DIAGNOSIS — F411 Generalized anxiety disorder: Secondary | ICD-10-CM | POA: Diagnosis not present

## 2018-01-31 MED ORDER — SERTRALINE HCL 100 MG PO TABS: 150.0000 mg | ORAL_TABLET | Freq: Every day | ORAL | 0 refills | Status: DC

## 2018-01-31 NOTE — Progress Notes (Signed)
Patient ID: Adam Herrera, male   DOB: December 04, 1980, 38 y.o.   MRN: [id number]   HPI  JERROLL CHERRY presents to the office today for follow-up of anxiety. "Overall everything is better." He reports that he noticed his anxiety was better this Christmas and did not notice "agitation and aggravation" like he has had in years past when there was clutter and chaos. Reports that he was better able to enjoy the holidays. Reports that he continues to like things to be clean and organized. Better able to tolerate clutter.  Reports that he is doing well at work and "I think I am a lot calmer at work." Reports that they recently got a puppy and that this has caused him some anxiety with having to tell 38 yo that the puppy is "not a toy." Reports that he frequently has to check where puppy is and describes being unable to relax. Denies any physical s/s of anxiety. Reports that he has awakened twice with panic s/s and gasping for air, coughing, and trying to get air in and reports that this occurred before getting the puppy. Reports that he has not been eating excessively like he has in the past when stressed. He reports that worry has not been excessive. 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  He reports irritability has improved compared to the past. Denies depressed mood. He reports that he awakens every night around 1-2 am for several hours. Reports that he has tried Melatonin and it helps with sleep initiation but not middle of the night awakening. Estimates sleeping about 5-7 hours a night. Avoids caffeine late in the day and typically only has soda once weekly. Reports that appetite is stable. Energy and motivation have been good. Working out about 4 days a week. Concentration is adequate. Denies SI.  Past Psychiatric Medication Trials:  Seroquel- Reports  vivid dreams and nightmares with perceptual disturbances. Helped stabilize mood.  Lexapro- Wt gain, sexual side effects. Helped with anxiety. Reports initially felt very calm, "like a zombie."  Paxil- Ineffective  Review of Systems:  Review of Systems  Musculoskeletal: Negative for gait problem.  Neurological: Negative for tremors.  Psychiatric/Behavioral:  Please refer to HPI  Medications: I have reviewed the patient's current medications.        Current Outpatient Medications  Medication Sig Dispense Refill  . sertraline (ZOLOFT) 100 MG tablet Take 1-1.5 tabs po qd 135 tablet 0  . valACYclovir (VALTREX) 1000 MG tablet valacyclovir 1 gram tablet     No current facility-administered medications for this visit.    Medication Side Effects: None  Allergies:       Allergies  Allergen Reactions  . Doxycycline     Severe headache  . Penicillins Rash       Past Medical History:  Diagnosis Date  . Anxiety         Family History  Problem Relation Age of Onset  . Anxiety disorder Mother   . Depression Mother   . Alcohol abuse Paternal Grandfather   . Heart attack Paternal Grandfather   . Anxiety disorder Maternal Aunt   . Anxiety disorder Maternal Uncle    Social History        Socioeconomic History  . Marital status: Married    Spouse name: Not on file  . Number of children: Not on file  .  Years of education: Not on file  . Highest education level: Not on file  Occupational History  . Not on file  Social Needs  . Financial resource strain: Not on file  . Food insecurity:    Worry: Not on file    Inability: Not on file  . Transportation needs:    Medical: Not on file    Non-medical: Not on file  Tobacco Use  . Smoking status: Never Smoker  . Smokeless tobacco: Never Used  Substance and Sexual Activity  . Alcohol use: Yes    Comment: Occasional social ETOH use on the weekends  . Drug use: No  . Sexual activity: Not on file  Lifestyle  . Physical activity:     Days per week: Not on file    Minutes per session: Not on file  . Stress: Not on file  Relationships  . Social connections:    Talks on phone: Not on file    Gets together: Not on file    Attends religious service: Not on file    Active member of club or organization: Not on file    Attends meetings of clubs or organizations: Not on file    Relationship status: Not on file  . Intimate partner violence:    Fear of current or ex partner: Not on file    Emotionally abused: Not on file    Physically abused: Not on file    Forced sexual activity: Not on file  Other Topics Concern  . Not on file  Social History Narrative  . Not on file   Past Medical History, Surgical history, Social history, and Family history were reviewed and updated as appropriate.  Please see review of systems for further details on the patient's review from today.  Objective:  Physical Exam:  There were no vitals taken for this visit.  Physical Exam: Mood: Anxious. Affect: Anxious. Judgment and insight intact. Alert and oriented x 3. No psychotic s/s evident on exam. No evidence of imminent danger to himself or others. Denies SI. Calm and cooperative.    .res  Assessment: Plan:   Generalized Anxiety D/O- Discussed potential benefits, risks, and side effects of increasing Sertraline to 150 mg po qd to improve anxiety and irritability since these s/s have significantly improved with Sertraline 100 mg po qd, however pt continues to have some mild residual s/s. Discussed trial of decreasing Melatonin since this has not been helpful for middle of the night awakenings. Advised pt to call if sleep worsens and he agrees to do so. Pt prefers not to start additional medication for sleep at this time. Pt to f/u in 3 months or sooner if clinically indicated.   -------------------------------

## 2018-03-24 ENCOUNTER — Other Ambulatory Visit: Payer: Self-pay | Admitting: Psychiatry

## 2018-03-24 DIAGNOSIS — F411 Generalized anxiety disorder: Secondary | ICD-10-CM

## 2018-05-02 ENCOUNTER — Ambulatory Visit: Payer: BLUE CROSS/BLUE SHIELD | Admitting: Psychiatry

## 2018-05-16 ENCOUNTER — Ambulatory Visit (INDEPENDENT_AMBULATORY_CARE_PROVIDER_SITE_OTHER): Payer: BLUE CROSS/BLUE SHIELD | Admitting: Psychiatry

## 2018-05-16 ENCOUNTER — Other Ambulatory Visit: Payer: Self-pay

## 2018-05-16 DIAGNOSIS — F411 Generalized anxiety disorder: Secondary | ICD-10-CM

## 2018-05-16 NOTE — Progress Notes (Signed)
Attempted to reach patient x 3 for scheduled tele-visit. Unable to reach pt. Left message recommending that he contact the office to re-schedule.

## 2018-05-29 ENCOUNTER — Encounter: Payer: Self-pay | Admitting: Psychiatry

## 2018-05-29 ENCOUNTER — Ambulatory Visit (INDEPENDENT_AMBULATORY_CARE_PROVIDER_SITE_OTHER): Payer: BLUE CROSS/BLUE SHIELD | Admitting: Psychiatry

## 2018-05-29 ENCOUNTER — Other Ambulatory Visit: Payer: Self-pay

## 2018-05-29 DIAGNOSIS — F411 Generalized anxiety disorder: Secondary | ICD-10-CM

## 2018-05-29 MED ORDER — SERTRALINE HCL 100 MG PO TABS: 150.0000 mg | ORAL_TABLET | Freq: Every day | ORAL | 0 refills | Status: DC

## 2018-05-29 NOTE — Progress Notes (Signed)
Adam Herrera [id number] [birth year] 38 y.o.  Virtual Visit via Video Note  I connected with@ on 05/29/18 at  3:30 PM EDT by a video enabled telemedicine application and verified that I am speaking with the correct person using two identifiers.   I discussed the limitations of evaluation and management by telemedicine and the availability of in person appointments. The patient expressed understanding and agreed to proceed.  I discussed the assessment and treatment plan with the patient. The patient was provided an opportunity to ask questions and all were answered. The patient agreed with the plan and demonstrated an understanding of the instructions.   The patient was advised to call back or seek an in-person evaluation if the symptoms worsen or if the condition fails to improve as anticipated.  I provided 30 minutes of non-face-to-face time during this encounter.  The patient was located at home.  The provider was located at home.   Adam Herrera, PMHNP   Subjective:   Patient ID:  Adam Herrera is a 38 y.o. (DOB April 18, 1980) male.  Chief Complaint:  Chief Complaint  Patient presents with  . Follow-up    Anxiety    HPI [name] presents to the office today for follow-up of anxiety.  He reports that he and his family have had several recent changes with the pandemic to include children being home instead of in school.  Wife is a Armed forces operational officer and has been out of work and dentist paid her a full 6-week salary. 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 Reports that renters moved out of home they owned in April and renters left the house in disarray and that it took significantly longer to clean and restore the house. He reports that he had some stress and anxiety related to this and had worry about the house. Reports that they put the house on the market last week and it is already under contract. Reports some mild worry about the house, such as  wondering if it will pass inspection or if there will be an issue with the sale. Denies any panic attacks.   He reports that his anxiety has been "really, really good at work." He reports that he continues to have some anxiety and irritation at home and finds "all the things wrong." He reports that clutter and disorganization trigger his anxiety and agitation. "I think it will always bother me a little bit." He reports that he finds a clean environment calming.    Reports some occasional irritability with anxiety and that he is able to decrease irritability by going into a room alone and watching TV. He reports that he has been sleeping better.  Has been working 5 am-2 pm and is taking an hour long nap and then going to bed between 8-9 pm. He reports that his appetite has been ok and slightly increased. He reports that he would like to exercise and looks forward to being able to go to the gym when it re-opens. Reports that his energy and motivation have been adequate. Denies concentration impairment.   Past Psychiatric Medication Trials:  Seroquel- Reports vivid dreams and nightmares with perceptual disturbances. Helped stabilize mood.  Lexapro- Wt gain, sexual side effects. Helped with anxiety. Reports initially felt very calm, "like a zombie."  Paxil- Ineffective  Sertraline  Review of Systems:  Review of Systems  Musculoskeletal: Negative for gait problem.  Neurological: Negative for tremors.  Psychiatric/Behavioral:       Please refer to  HPI    Medications: I have reviewed the patient's current medications.  Current Outpatient Medications  Medication Sig Dispense Refill  . Multiple Vitamins-Minerals (MULTIVITAMIN GUMMIES ADULT PO) Take by mouth.    . sertraline (ZOLOFT) 100 MG tablet Take 1.5 tablets (150 mg total) by mouth daily. 135 tablet 0  . valACYclovir (VALTREX) 1000 MG tablet valacyclovir 1 gram tablet     No current facility-administered medications for this visit.      Medication Side Effects: None  Allergies:  Allergies  Allergen Reactions  . Doxycycline     Severe headache  . Penicillins Rash    Past Medical History:  Diagnosis Date  . Anxiety     Family History  Problem Relation Age of Onset  . Anxiety disorder Mother   . Depression Mother   . Alcohol abuse Paternal Grandfather   . Heart attack Paternal Grandfather   . Anxiety disorder Maternal Aunt   . Anxiety disorder Maternal Uncle     Social History   Socioeconomic History  . Marital status: Married    Spouse name: Not on file  . Number of children: Not on file  . Years of education: Not on file  . Highest education level: Not on file  Occupational History  . Not on file  Social Needs  . Financial resource strain: Not on file  . Food insecurity:    Worry: Not on file    Inability: Not on file  . Transportation needs:    Medical: Not on file    Non-medical: Not on file  Tobacco Use  . Smoking status: Never Smoker  . Smokeless tobacco: Never Used  Substance and Sexual Activity  . Alcohol use: Yes    Comment: Occasional social ETOH use on the weekends  . Drug use: No  . Sexual activity: Not on file  Lifestyle  . Physical activity:    Days per week: Not on file    Minutes per session: Not on file  . Stress: Not on file  Relationships  . Social connections:    Talks on phone: Not on file    Gets together: Not on file    Attends religious service: Not on file    Active member of club or organization: Not on file    Attends meetings of clubs or organizations: Not on file    Relationship status: Not on file  . Intimate partner violence:    Fear of current or ex partner: Not on file    Emotionally abused: Not on file    Physically abused: Not on file    Forced sexual activity: Not on file  Other Topics Concern  . Not on file  Social History Narrative  . Not on file    Past Medical History, Surgical history, Social history, and Family history were  reviewed and updated as appropriate.   Please see review of systems for further details on the patient's review from today.   Objective:   Physical Exam:  There were no vitals taken for this visit.  Physical Exam Neurological:     Mental Status: He is alert and oriented to person, place, and time.     Cranial Nerves: No dysarthria.  Psychiatric:        Attention and Perception: Attention normal.        Speech: Speech normal.        Behavior: Behavior is cooperative.        Thought Content: Thought content normal. Thought content is  not paranoid or delusional. Thought content does not include homicidal or suicidal ideation. Thought content does not include homicidal or suicidal plan.        Cognition and Memory: Cognition and memory normal.        Judgment: Judgment normal.     Comments: Mood is appropriate to content. Affect is congruent.     Lab Review:     Component Value Date/Time   NA 141 05/05/2012 1528   K 3.9 05/05/2012 1528   CL 105 05/05/2012 1528   CO2 28 05/05/2012 1528   GLUCOSE 92 05/05/2012 1528   BUN 12 05/05/2012 1528   CREATININE 0.89 05/05/2012 1528   CALCIUM 9.3 05/05/2012 1528   GFRNONAA >90 05/05/2012 1528   GFRAA >90 05/05/2012 1528       Component Value Date/Time   WBC 5.6 05/05/2012 1528   RBC 4.51 05/05/2012 1528   HGB 13.7 05/05/2012 1528   HCT 39.3 05/05/2012 1528   PLT 180 05/05/2012 1528   MCV 87.1 05/05/2012 1528   MCH 30.4 05/05/2012 1528   MCHC 34.9 05/05/2012 1528   RDW 12.8 05/05/2012 1528    No results found for: POCLITH, LITHIUM   No results found for: PHENYTOIN, PHENOBARB, VALPROATE, CBMZ   .res Assessment: Plan:   Discussed treatment plan and patient reports that he may have been in less easily irritated when on sertraline 100 mg compared to 150 mg daily.  He reports, "I wouldn't want to change anything right now" with current situational stressors.  Will continue current plan of care at this time. Continue sertraline  150 mg daily for anxiety.  Generalized anxiety disorder - Plan: sertraline (ZOLOFT) 100 MG tablet  Please see After Visit Summary for patient specific instructions.  No future appointments.  No orders of the defined types were placed in this encounter.     -------------------------------

## 2018-06-05 ENCOUNTER — Ambulatory Visit: Payer: BLUE CROSS/BLUE SHIELD | Admitting: Psychiatry

## 2018-09-01 ENCOUNTER — Ambulatory Visit (INDEPENDENT_AMBULATORY_CARE_PROVIDER_SITE_OTHER): Payer: BC Managed Care – PPO | Admitting: Psychiatry

## 2018-09-01 ENCOUNTER — Encounter: Payer: Self-pay | Admitting: Psychiatry

## 2018-09-01 ENCOUNTER — Other Ambulatory Visit: Payer: Self-pay

## 2018-09-01 DIAGNOSIS — F99 Mental disorder, not otherwise specified: Secondary | ICD-10-CM

## 2018-09-01 DIAGNOSIS — F411 Generalized anxiety disorder: Secondary | ICD-10-CM | POA: Diagnosis not present

## 2018-09-01 DIAGNOSIS — F5105 Insomnia due to other mental disorder: Secondary | ICD-10-CM

## 2018-09-01 DIAGNOSIS — F422 Mixed obsessional thoughts and acts: Secondary | ICD-10-CM | POA: Diagnosis not present

## 2018-09-01 MED ORDER — VIIBRYD STARTER PACK 10 & 20 MG PO KIT: PACK | ORAL | 0 refills | Status: DC

## 2018-09-01 MED ORDER — TRAZODONE HCL 100 MG PO TABS: ORAL_TABLET | ORAL | 1 refills | Status: DC

## 2018-09-01 NOTE — Progress Notes (Signed)
Adam Herrera [id number] Mar 03, 1980 38 y.o.  Virtual Visit via Video Note  I connected with pt @ on 09/01/18 at 11:00 AM EDT by a video enabled telemedicine application and verified that I am speaking with the correct person using two identifiers.   I discussed the limitations of evaluation and management by telemedicine and the availability of in person appointments. The patient expressed understanding and agreed to proceed.  I discussed the assessment and treatment plan with the patient. The patient was provided an opportunity to ask questions and all were answered. The patient agreed with the plan and demonstrated an understanding of the instructions.   The patient was advised to call back or seek an in-person evaluation if the symptoms worsen or if the condition fails to improve as anticipated.  I provided 30 minutes of non-face-to-face time during this encounter.  The patient was located in his personal vehicle.  The provider was located at [place].   Thayer Headings, PMHNP   Subjective:   Patient ID:  Adam Herrera is a 38 y.o. (DOB 1980/02/14) male.  Chief Complaint:  Chief Complaint  Patient presents with  . Other    Irritability  . Anxiety  . Insomnia    HPI Tysin T Cutbirth presents for follow-up of anxiety, irritability, and h/o depression.   He reports that he was trying to remain calm and then "it builds up." "I got off my medicine." He reports that he gradually decreased Sertraline and has been off Sertraline for 1.5 weeks.  Reports that he has been exeriencing irritability and anger. Reports that wife has commented that he seems like a different person and they have had more relationship issues recently.  He notices increased irritability, anxiety, and OCD since stopping Sertraline. He reports that he has been easily irritated by clutter and at times will go up to his room instead of spending time with family since clutter would irritate  him. Obsessing over things needing to be cleaned. Reports cleaning floors daily. He notices lower frustration tolerance. Denies any recent panic attacks. He has had some slight depression.  He reports that his sleep has been disrupted. He reports that he was having some issues with sleep when taking medication and would be awake for about 2 hours in the middle of the night. Some nights experiences multiple awakenings. He reports that sleep has been the same without medication. He reports that he does not feel tired during the day. Energy has been ok. Motivation has been good. Appetite has been ok. He reports gaining weight during COVID. Concentration has been ok. Denies SI.   Daughter has started kindergarten online.   Reports that mother rarely comes to visit them but will visit other family members. Mother recently said she was not coming for pt's daughter's birthday.  Reports that he recently confronted his mother and does not remember what he said. His wife said he was acting in a way that was unusual for him.   Past Psychiatric Medication Trials:  Seroquel- Reports vivid dreams and nightmares with perceptual disturbances. Helped stabilize mood.  Lexapro- Wt gain, sexual side effects. Helped with anxiety. Reports initially felt very calm, "like a zombie."  Paxil- Ineffective  Sertraline- Effective. Initially caused some sleep disturbance. Reports that it did not seem to be as effective over time.   Review of Systems:  Review of Systems  Constitutional: Positive for unexpected weight change.  Musculoskeletal: Negative for gait problem.  Neurological: Negative for tremors.  Psychiatric/Behavioral:  Please refer to HPI    Medications: I have reviewed the patient's current medications.  Current Outpatient Medications  Medication Sig Dispense Refill  . Multiple Vitamins-Minerals (MULTIVITAMIN GUMMIES ADULT PO) Take by mouth.    . valACYclovir (VALTREX) 1000 MG tablet valacyclovir 1 gram  tablet    . sertraline (ZOLOFT) 100 MG tablet Take 1.5 tablets (150 mg total) by mouth daily. 135 tablet 0  . traZODone (DESYREL) 100 MG tablet Take 1/2-1 tablet po QHS prn insomnia 30 tablet 1  . Vilazodone HCl (VIIBRYD STARTER PACK) 10 & 20 MG KIT Take 5 mg by mouth daily for 7 days, THEN 10 mg daily for 7 days, THEN 20 mg daily for 21 days. 3 kit 0   No current facility-administered medications for this visit.     Medication Side Effects: Other: N/A  Allergies:  Allergies  Allergen Reactions  . Doxycycline     Severe headache  . Penicillins Rash    Past Medical History:  Diagnosis Date  . Anxiety     Family History  Problem Relation Age of Onset  . Anxiety disorder Mother   . Depression Mother   . Alcohol abuse Paternal Grandfather   . Heart attack Paternal Grandfather   . Anxiety disorder Maternal Aunt   . Anxiety disorder Maternal Uncle     Social History   Socioeconomic History  . Marital status: Married    Spouse name: Not on file  . Number of children: Not on file  . Years of education: Not on file  . Highest education level: Not on file  Occupational History  . Not on file  Social Needs  . Financial resource strain: Not on file  . Food insecurity    Worry: Not on file    Inability: Not on file  . Transportation needs    Medical: Not on file    Non-medical: Not on file  Tobacco Use  . Smoking status: Never Smoker  . Smokeless tobacco: Never Used  Substance and Sexual Activity  . Alcohol use: Yes    Comment: Occasional social ETOH use on the weekends  . Drug use: No  . Sexual activity: Not on file  Lifestyle  . Physical activity    Days per week: Not on file    Minutes per session: Not on file  . Stress: Not on file  Relationships  . Social Herbalist on phone: Not on file    Gets together: Not on file    Attends religious service: Not on file    Active member of club or organization: Not on file    Attends meetings of clubs or  organizations: Not on file    Relationship status: Not on file  . Intimate partner violence    Fear of current or ex partner: Not on file    Emotionally abused: Not on file    Physically abused: Not on file    Forced sexual activity: Not on file  Other Topics Concern  . Not on file  Social History Narrative  . Not on file    Past Medical History, Surgical history, Social history, and Family history were reviewed and updated as appropriate.   Please see review of systems for further details on the patient's review from today.   Objective:   Physical Exam:  There were no vitals taken for this visit.  Physical Exam Neurological:     Mental Status: He is alert and oriented to person, place, and  time.     Cranial Nerves: No dysarthria.  Psychiatric:        Attention and Perception: Attention normal.        Mood and Affect: Mood is anxious.        Speech: Speech normal.        Behavior: Behavior is cooperative.        Thought Content: Thought content normal. Thought content is not paranoid or delusional. Thought content does not include homicidal or suicidal ideation. Thought content does not include homicidal or suicidal plan.        Cognition and Memory: Cognition and memory normal.        Judgment: Judgment normal.     Comments: Insight is good     Lab Review:     Component Value Date/Time   NA 141 05/05/2012 1528   K 3.9 05/05/2012 1528   CL 105 05/05/2012 1528   CO2 28 05/05/2012 1528   GLUCOSE 92 05/05/2012 1528   BUN 12 05/05/2012 1528   CREATININE 0.89 05/05/2012 1528   CALCIUM 9.3 05/05/2012 1528   GFRNONAA >90 05/05/2012 1528   GFRAA >90 05/05/2012 1528       Component Value Date/Time   WBC 5.6 05/05/2012 1528   RBC 4.51 05/05/2012 1528   HGB 13.7 05/05/2012 1528   HCT 39.3 05/05/2012 1528   PLT 180 05/05/2012 1528   MCV 87.1 05/05/2012 1528   MCH 30.4 05/05/2012 1528   MCHC 34.9 05/05/2012 1528   RDW 12.8 05/05/2012 1528    No results found for:  POCLITH, LITHIUM   No results found for: PHENYTOIN, PHENOBARB, VALPROATE, CBMZ   .res Assessment: Plan:   Discussed possible tx options to include re-starting Sertraline or starting Viibryd. Discussed potential benefits, risks, and side effects of Viibryd. Pt agrees to trial of Viibryd. Will start Viibryd 5 mg po qd for 5-7 days, then 10 mg qd x 7 days, then 20 mg po qd for anxiety. Will pull samples for pt to come to office to pick up today after work.  Discussed potential benefits of therapy and pt reports that he may be interested in starting therapy, depending upon cost. Recommended pt talk with office staff re: what his copay would be and scheduling apt if therapy is not cost prohibitive. Pt to f/u with this provider in 4-6 weeks or sooner if clinically indicated.  Patient advised to contact office with any questions, adverse effects, or acute worsening in signs and symptoms.  Kalid was seen today for other, anxiety and insomnia.  Diagnoses and all orders for this visit:  Insomnia due to other mental disorder -     traZODone (DESYREL) 100 MG tablet; Take 1/2-1 tablet po QHS prn insomnia  Generalized anxiety disorder -     Vilazodone HCl (VIIBRYD STARTER PACK) 10 & 20 MG KIT; Take 5 mg by mouth daily for 7 days, THEN 10 mg daily for 7 days, THEN 20 mg daily for 21 days.  Mixed obsessional thoughts and acts -     Vilazodone HCl (VIIBRYD STARTER PACK) 10 & 20 MG KIT; Take 5 mg by mouth daily for 7 days, THEN 10 mg daily for 7 days, THEN 20 mg daily for 21 days.     Please see After Visit Summary for patient specific instructions.  Future Appointments  Date Time Provider [place]  09/10/2018  9:00 AM [place], [facility] CP-CP None  09/29/2018 11:30 AM Thayer Headings, PMHNP CP-CP None    No  orders of the defined types were placed in this encounter.     -------------------------------

## 2018-09-01 NOTE — Patient Instructions (Signed)
Start Viibryd 5 mg daily with a meal for 5 to 7 days, then increase to 10 mg daily for 7 days, then increase to 20 mg daily.  If experiencing some side effects, recommend waiting until side effects improve before increasing dose and may also go from 10 mg to 15 mg daily if needed due to side effects.  Please call office with any questions or if side effects are intolerable.  Remember to take Viibryd with a meal to minimize risk of any GI side effects.  Can start trazodone 100 mg 1/2-1 tab at bedtime as needed for insomnia.

## 2018-09-10 ENCOUNTER — Ambulatory Visit (INDEPENDENT_AMBULATORY_CARE_PROVIDER_SITE_OTHER): Payer: BC Managed Care – PPO | Admitting: Mental Health

## 2018-09-10 ENCOUNTER — Other Ambulatory Visit: Payer: Self-pay

## 2018-09-10 DIAGNOSIS — F411 Generalized anxiety disorder: Secondary | ICD-10-CM

## 2018-09-10 NOTE — Progress Notes (Signed)
[facility] Counselor Initial Adult Exam  Name: Adam Herrera Date: 09/10/2018 MRN: [id number] DOB: October 05, 1980 PCP: Patient, No Pcp Per  Time spent: 50 minutes  Treatment: Assessment  Reason for Visit /Presenting Problem: reports he copes w/ anxiety, gets upset when "things are in the wrong place". Anxiety is constant, irritability occurs. Gets upset when his wife or son leave the house untidy. He stated he knows he should not get so upset about this. He may isolate, stay in his room away from family at times. Some increased marital stress recently. He decided to stop his medications about a month ago. He stated this was not helpful as he has been more irritable over the past month. When he was a child, he would throw toys away he did not use anymore. He stated in childhood, they were assigned chores. Reports feelings of depression, "I'm upset b/c I get my family upset".  His sleep has been interrupted at night, is rx'd medication has been helpful.    Mental Status Exam:   Appearance:   Casual     Behavior:  Appropriate  Motor:  Normal  Speech/Language:   Clear and Coherent  Affect:  Full range  Mood:  anxious  Thought process:  normal  Thought content:    WNL  Sensory/Perceptual disturbances:    WNL  Orientation:  oriented to person, place, time/date and situation  Attention:  Good  Concentration:  Good  Memory:  intact  Fund of knowledge:   Good  Insight:    Good  Judgment:   Good  Impulse Control:  fair   Reported Symptoms:   Anxiety daily, obsessive thinking, irritability, intermittent depressed mood, sleep interruption  Risk Assessment: Danger to Self:  No Self-injurious Behavior: No Danger to Others: No Duty to Warn:no Physical Aggression / Violence:No  Access to Firearms a concern: No  Gang Involvement:No  Patient / guardian was educated about steps to take if suicide or homicide risk level increases between visits: yes While future psychiatric events cannot be  accurately predicted, the patient does not currently require acute inpatient psychiatric care and does not currently meet [facility] involuntary commitment criteria.  Substance Abuse History: Current substance abuse: No     Past Psychiatric History:   Previous psychological history is significant for anxiety Outpatient Providers: [facility] History of Psych Hospitalization: No  Psychological Testing: none  Abuse History: Victim - none Report needed: No. Victim of Neglect:No. Perpetrator- none Witness / Exposure to Domestic Violence: No   Protective Services Involvement: No  Witness to [employer] Violence:  No   Family History:  Family History  Problem Relation Age of Onset  . Anxiety disorder Mother   . Depression Mother   . Alcohol abuse Paternal Grandfather   . Heart attack Paternal Grandfather   . Anxiety disorder Maternal Aunt   . Anxiety disorder Maternal Uncle     Living situation: lives w/ wife and children  Sexual Orientation:  Straight  Relationship Status: married  Name of spouse / other:  Nira Conn             If a parent, number of children / ages: 3 children -ages 38 and 61 (45 y/o from wife first marriage)    Garment/textile technologist; wife, family  Pt was raised by his mother, Father deceased when pt was age 38. 61 sister - age 25.  Mother remarried and lives in [place] w/ his stepfather.   Financial Stress:  No   Income/Employment/Disability: full time at [employer]  Military Service: No   Educational History: Education: assoc. In Business Adminstration  Religion/Sprituality/World View:    Christian  Any cultural differences that may affect / interfere with treatment:  none  Recreation/Hobbies:  Watch sports, golf, movies  Stressors: family  Strengths:  Supportive Relationships  Barriers:   none   Legal History: Pending legal issue / charges: none History of legal issue / charges: none  Medical History/Surgical History:reviewed Past Medical  History:  Diagnosis Date  . Anxiety     Past Surgical History:  Procedure Laterality Date  . APPENDECTOMY    . CARDIAC SURGERY      Medications: Current Outpatient Medications  Medication Sig Dispense Refill  . Multiple Vitamins-Minerals (MULTIVITAMIN GUMMIES ADULT PO) Take by mouth.    . sertraline (ZOLOFT) 100 MG tablet Take 1.5 tablets (150 mg total) by mouth daily. 135 tablet 0  . traZODone (DESYREL) 100 MG tablet Take 1/2-1 tablet po QHS prn insomnia 30 tablet 1  . valACYclovir (VALTREX) 1000 MG tablet valacyclovir 1 gram tablet    . Vilazodone HCl (VIIBRYD STARTER PACK) 10 & 20 MG KIT Take 5 mg by mouth daily for 7 days, THEN 10 mg daily for 7 days, THEN 20 mg daily for 21 days. 3 kit 0   No current facility-administered medications for this visit.     Allergies  Allergen Reactions  . Doxycycline     Severe headache  . Penicillins Rash    Diagnoses:    ICD-10-CM   1. Generalized anxiety disorder  F41.1     Individualized Plan of Care:  1. Patient to engage psychiatric evaluation and follow medication regimen.  2. Patient to engage in individual psychotherapy.  3. Patient to identify and apply coping skills learned in session to decrease symptoms.  4. Patient to learn and apply CBT, problem solving, communication skills and strategies learned in session to improve relationship with      Family members and decrease anxiety triggers.  5. Patient to contact this office, go to the local ED or call 911 if a crisis or emergency develops between visits.   [place], [facility]

## 2018-09-29 ENCOUNTER — Other Ambulatory Visit: Payer: Self-pay

## 2018-09-29 ENCOUNTER — Ambulatory Visit (INDEPENDENT_AMBULATORY_CARE_PROVIDER_SITE_OTHER): Payer: BC Managed Care – PPO | Admitting: Psychiatry

## 2018-09-29 ENCOUNTER — Ambulatory Visit (INDEPENDENT_AMBULATORY_CARE_PROVIDER_SITE_OTHER): Payer: BC Managed Care – PPO | Admitting: Mental Health

## 2018-09-29 DIAGNOSIS — F411 Generalized anxiety disorder: Secondary | ICD-10-CM | POA: Diagnosis not present

## 2018-09-29 DIAGNOSIS — F422 Mixed obsessional thoughts and acts: Secondary | ICD-10-CM

## 2018-09-29 MED ORDER — VIIBRYD 20 MG PO TABS: 20.0000 mg | ORAL_TABLET | Freq: Every day | ORAL | 0 refills | Status: DC

## 2018-09-29 NOTE — Progress Notes (Signed)
[facility] psychotherapy note    Name: ANDEN BARTOLO Date: 09/29/2018 MRN: [id number] DOB: [birth year] PCP: Patient, No Pcp Per  Time spent: 50 minutes  Treatment:  Individual therapy  Mental Status Exam:   Appearance:   Casual     Behavior:  Appropriate  Motor:  Normal  Speech/Language:   Clear and Coherent  Affect:  Full range  Mood:  anxious  Thought process:  normal  Thought content:    WNL  Sensory/Perceptual disturbances:    WNL  Orientation:  oriented to person, place, time/date and situation  Attention:  Good  Concentration:  Good  Memory:  intact  Fund of knowledge:   Good  Insight:    Good  Judgment:   Good  Impulse Control:  fair   Reported Symptoms:   Anxiety daily, obsessive thinking, irritability, intermittent depressed mood, sleep interruption  Risk Assessment: Danger to Self:  No Self-injurious Behavior: No Danger to Others: No Duty to Warn:no Physical Aggression / Violence:No  Access to Firearms a concern: No  Gang Involvement:No  Patient / guardian was educated about steps to take if suicide or homicide risk level increases between visits: yes While future psychiatric events cannot be accurately predicted, the patient does not currently require acute inpatient psychiatric care and does not currently meet [facility] involuntary commitment criteria.     Medical History/Surgical History:reviewed Past Medical History:  Diagnosis Date  . Anxiety     Past Surgical History:  Procedure Laterality Date  . APPENDECTOMY    . CARDIAC SURGERY      Medications: Current Outpatient Medications  Medication Sig Dispense Refill  . Multiple Vitamins-Minerals (MULTIVITAMIN GUMMIES ADULT PO) Take by mouth.    . sertraline (ZOLOFT) 100 MG tablet Take 1.5 tablets (150 mg total) by mouth daily. 135 tablet 0  . traZODone (DESYREL) 100 MG tablet Take 1/2-1 tablet po QHS prn insomnia 30 tablet 1  . valACYclovir (VALTREX) 1000 MG tablet  valacyclovir 1 gram tablet    . Vilazodone HCl (VIIBRYD STARTER PACK) 10 & 20 MG KIT Take 5 mg by mouth daily for 7 days, THEN 10 mg daily for 7 days, THEN 20 mg daily for 21 days. 3 kit 0   No current facility-administered medications for this visit.     Subjective:  He stated he is trying to get the "perspective my wife has and not worry about so much, which is when I get irritable". He stated most issues he gets upset about, are "not really big deals".  An example is he cleans while he cooks, his wife cooks and there is cleaning to be done later. He stated his wife is a Copywriter, advertising, when off for 3 days, she is tired and things may get more untidy at home. He stated he knows that he has to change how he reacts.  He stated his kids leave the room often when he arrives b/c he will tell them to clean or pick up items that are not in their place. Encouraged Pt to identify anger producing thoughts in these situations between sessions. He also plans to talk w/ his wife about how he wants them to be able find more ways to compromise as these incidents can lead to their having arguments.   Diagnoses:    ICD-10-CM   1. Generalized anxiety disorder  F41.1     Individualized Plan of Care:  1. Patient to engage psychiatric evaluation and follow medication regimen.  2. Patient to engage in individual  psychotherapy.  3. Patient to identify and apply coping skills learned in session to decrease symptoms.  4. Patient to learn and apply CBT, problem solving, communication skills and strategies learned in session to improve relationship with      Family members and decrease anxiety triggers.  5. Patient to contact this office, go to the local ED or call 911 if a crisis or emergency develops between visits.   Anson [place], [facility]

## 2018-09-29 NOTE — Progress Notes (Signed)
ZOHAIB HEENEY [id number] 29-Nov-1980 38 y.o.  Subjective:   Patient ID:  CADAN MAGGART is a 38 y.o. (DOB Jul 10, 1980) male.  Chief Complaint:  Chief Complaint  Patient presents with  . Anxiety  . Follow-up    h/o insomnia    HPI Siddh T Stmartin presents to the office today for follow-up of anxiety and h/o depression. He is being seen for f/u after starting Viibryd. He reports that he has had some slight diarrhea with Viibryd. Has been taking Viibryd 20 mg po qd He reports "it is better than being off of it (meds) all together." He reports that he is less irritable now. Continues to have some mild irritability. He reports that he was trying to "bottle it up" in the past and this seemed "to make it worse." He reports that he continues to have anxiety and irritation with the house being cluttered or not as clean as he would like. He reports that he has been very organized since childhood. He reports that most of his anxiety is related to the house. He reports that he feels compelled to point out when things are out of order. Denies worry or panic s/s. Denies recent depressed mood. He reports that he has taken Trazodone prn periodically. He reports sleep has been adequate overall. Appetite has been stable. Energy and motivation have been adequate. Denies concentration impairment. Denies SI.   He reports that he and his wife have been communicating more.   Past Psychiatric Medication Trials:  Seroquel- Reports vivid dreams and nightmares with perceptual disturbances. Helped stabilize mood.  Lexapro- Wt gain, sexual side effects. Helped with anxiety. Reports initially felt very calm, "like a zombie."  Paxil- Ineffective Sertraline- Effective. Initially caused some sleep disturbance. Reports that it did not seem to be as effective over time.  Trazodone  Review of Systems:  Review of Systems  Gastrointestinal: Positive for diarrhea. Negative for nausea.  Musculoskeletal: Negative  for gait problem.  Neurological: Negative for tremors.  Psychiatric/Behavioral:       Please refer to HPI    Medications: I have reviewed the patient's current medications.  Current Outpatient Medications  Medication Sig Dispense Refill  . Multiple Vitamins-Minerals (MULTIVITAMIN GUMMIES ADULT PO) Take by mouth.    . traZODone (DESYREL) 100 MG tablet Take 1/2-1 tablet po QHS prn insomnia 30 tablet 1  . valACYclovir (VALTREX) 1000 MG tablet valacyclovir 1 gram tablet    . Vilazodone HCl (VIIBRYD STARTER PACK) 10 & 20 MG KIT Take 5 mg by mouth daily for 7 days, THEN 10 mg daily for 7 days, THEN 20 mg daily for 21 days. 3 kit 0  . Vilazodone HCl (VIIBRYD) 20 MG TABS Take 1 tablet (20 mg total) by mouth daily. 7 tablet 0   No current facility-administered medications for this visit.     Medication Side Effects: Other: Mild diarrhea Reports diarrhea occurs once in the morning.   Allergies:  Allergies  Allergen Reactions  . Doxycycline     Severe headache  . Penicillins Rash    Past Medical History:  Diagnosis Date  . Anxiety     Family History  Problem Relation Age of Onset  . Anxiety disorder Mother   . Depression Mother   . Alcohol abuse Paternal Grandfather   . Heart attack Paternal Grandfather   . Anxiety disorder Maternal Aunt   . Anxiety disorder Maternal Uncle     Social History   Socioeconomic History  . Marital status: Married  Spouse name: Not on file  . Number of children: Not on file  . Years of education: Not on file  . Highest education level: Not on file  Occupational History  . Not on file  Social Needs  . Financial resource strain: Not on file  . Food insecurity    Worry: Not on file    Inability: Not on file  . Transportation needs    Medical: Not on file    Non-medical: Not on file  Tobacco Use  . Smoking status: Never Smoker  . Smokeless tobacco: Never Used  Substance and Sexual Activity  . Alcohol use: Yes    Comment: Occasional  social ETOH use on the weekends  . Drug use: No  . Sexual activity: Not on file  Lifestyle  . Physical activity    Days per week: Not on file    Minutes per session: Not on file  . Stress: Not on file  Relationships  . Social Herbalist on phone: Not on file    Gets together: Not on file    Attends religious service: Not on file    Active member of club or organization: Not on file    Attends meetings of clubs or organizations: Not on file    Relationship status: Not on file  . Intimate partner violence    Fear of current or ex partner: Not on file    Emotionally abused: Not on file    Physically abused: Not on file    Forced sexual activity: Not on file  Other Topics Concern  . Not on file  Social History Narrative  . Not on file    Past Medical History, Surgical history, Social history, and Family history were reviewed and updated as appropriate.   Please see review of systems for further details on the patient's review from today.   Objective:   Physical Exam:  There were no vitals taken for this visit.  Physical Exam Constitutional:      General: He is not in acute distress.    Appearance: He is well-developed.  Musculoskeletal:        General: No deformity.  Neurological:     Mental Status: He is alert and oriented to person, place, and time.     Coordination: Coordination normal.  Psychiatric:        Attention and Perception: Attention and perception normal. He does not perceive auditory or visual hallucinations.        Mood and Affect: Mood is anxious. Mood is not depressed. Affect is not labile, blunt, angry or inappropriate.        Speech: Speech normal.        Behavior: Behavior normal.        Thought Content: Thought content normal. Thought content is not paranoid or delusional. Thought content does not include homicidal or suicidal ideation. Thought content does not include homicidal or suicidal plan.        Cognition and Memory: Cognition and  memory normal.        Judgment: Judgment normal.     Comments: Insight intact. No delusions.      Lab Review:     Component Value Date/Time   NA 141 05/05/2012 1528   K 3.9 05/05/2012 1528   CL 105 05/05/2012 1528   CO2 28 05/05/2012 1528   GLUCOSE 92 05/05/2012 1528   BUN 12 05/05/2012 1528   CREATININE 0.89 05/05/2012 1528   CALCIUM 9.3 05/05/2012 1528  GFRNONAA >90 05/05/2012 1528   GFRAA >90 05/05/2012 1528       Component Value Date/Time   WBC 5.6 05/05/2012 1528   RBC 4.51 05/05/2012 1528   HGB 13.7 05/05/2012 1528   HCT 39.3 05/05/2012 1528   PLT 180 05/05/2012 1528   MCV 87.1 05/05/2012 1528   MCH 30.4 05/05/2012 1528   MCHC 34.9 05/05/2012 1528   RDW 12.8 05/05/2012 1528    No results found for: POCLITH, LITHIUM   No results found for: PHENYTOIN, PHENOBARB, VALPROATE, CBMZ   .res Assessment: Plan:   Seen for 30 minutes and greater than 50% of visit spent counseling patient regarding possible treatment options to include continuing Viibryd 20 mg daily since more time may be needed to determine full response and since patient has continued to have some diarrhea since starting Viibryd.  Discussed other option would be to gradually increase Viibryd as tolerated and to consider starting 30 mg and then increasing to 40 mg as tolerated. Agreed to continue Viibryd 20 mg daily at this time and discussed strategies to possibly improve tolerability of Viibryd.  Recommend continuing psychotherapy with Lanetta Inch, LPC. Patient to follow-up in 4 weeks or sooner if clinically indicated. Patient advised to contact office with any questions, adverse effects, or acute worsening in signs and symptoms.  Westen was seen today for anxiety and follow-up.  Diagnoses and all orders for this visit:  Generalized anxiety disorder  Mixed obsessional thoughts and acts  Other orders -     Vilazodone HCl (VIIBRYD) 20 MG TABS; Take 1 tablet (20 mg total) by mouth daily.      Please see After Visit Summary for patient specific instructions.  Future Appointments  Date Time Provider [place]  10/13/2018  2:00 PM Anson [place], [facility] CP-CP None  10/27/2018  2:00 PM Anson [place], [facility] CP-CP None    No orders of the defined types were placed in this encounter.   -------------------------------

## 2018-09-30 ENCOUNTER — Encounter: Payer: Self-pay | Admitting: Psychiatry

## 2018-09-30 MED ORDER — VIIBRYD 20 MG PO TABS
20.0000 mg | ORAL_TABLET | Freq: Every day | ORAL | 0 refills | Status: DC
Start: 2018-09-30 — End: 2018-10-06

## 2018-10-02 ENCOUNTER — Telehealth: Payer: Self-pay | Admitting: Psychiatry

## 2018-10-02 NOTE — Telephone Encounter (Signed)
Roderic Palau called and left message that he has questions about his medication.  Please call. No appt scheduled.

## 2018-10-02 NOTE — Telephone Encounter (Signed)
Thank you :)

## 2018-10-03 ENCOUNTER — Telehealth: Payer: Self-pay | Admitting: Psychiatry

## 2018-10-03 NOTE — Telephone Encounter (Signed)
Patient called and  Left a message last nightsaid that he wants to talk to the nurse about the pharmacy discount card . Please give him a call to discuss at [phone]

## 2018-10-06 ENCOUNTER — Other Ambulatory Visit: Payer: Self-pay

## 2018-10-06 DIAGNOSIS — F411 Generalized anxiety disorder: Secondary | ICD-10-CM

## 2018-10-06 DIAGNOSIS — F422 Mixed obsessional thoughts and acts: Secondary | ICD-10-CM

## 2018-10-06 MED ORDER — VIIBRYD 20 MG PO TABS: 20.0000 mg | ORAL_TABLET | Freq: Every day | ORAL | 1 refills | Status: DC

## 2018-10-06 NOTE — Telephone Encounter (Signed)
Patient concerned medication will be too expensive, taking Viibryd 20 mg. Will pull a box of samples for him to pick up and submit at [place] to his [employer]. Pt feels like the medication is effective.

## 2018-10-08 NOTE — Telephone Encounter (Signed)
Prior authorization not needed for this medication it's already on his formulary. Patient should take in copay card to run with insurance to check price.

## 2018-10-13 ENCOUNTER — Ambulatory Visit: Payer: BC Managed Care – PPO | Admitting: Mental Health

## 2018-10-21 ENCOUNTER — Telehealth: Payer: Self-pay | Admitting: Psychiatry

## 2018-10-21 NOTE — Telephone Encounter (Signed)
Pt needs refill on Viibryd. Please send to [facility] on [address].

## 2018-10-21 NOTE — Telephone Encounter (Signed)
I called the pharmacy and they have the medication flagged because he is taking Trazodone. I seen that the Trazodone is PRN. I left a VM for the pt. To return my call to find out how often he takes it. The pharmacist does not want to fill if he is on Trazodone due to interactions.

## 2018-10-22 NOTE — Telephone Encounter (Signed)
Agreed -

## 2018-10-22 NOTE — Telephone Encounter (Signed)
Spoke with pharmacist and they will go ahead and fill the [place].

## 2018-10-27 ENCOUNTER — Ambulatory Visit (INDEPENDENT_AMBULATORY_CARE_PROVIDER_SITE_OTHER): Payer: BC Managed Care – PPO | Admitting: Psychiatry

## 2018-10-27 ENCOUNTER — Other Ambulatory Visit: Payer: Self-pay

## 2018-10-27 ENCOUNTER — Ambulatory Visit: Payer: BC Managed Care – PPO | Admitting: Mental Health

## 2018-10-27 ENCOUNTER — Encounter: Payer: Self-pay | Admitting: Psychiatry

## 2018-10-27 DIAGNOSIS — F422 Mixed obsessional thoughts and acts: Secondary | ICD-10-CM | POA: Diagnosis not present

## 2018-10-27 DIAGNOSIS — F99 Mental disorder, not otherwise specified: Secondary | ICD-10-CM | POA: Diagnosis not present

## 2018-10-27 DIAGNOSIS — F411 Generalized anxiety disorder: Secondary | ICD-10-CM

## 2018-10-27 DIAGNOSIS — F5105 Insomnia due to other mental disorder: Secondary | ICD-10-CM

## 2018-10-27 MED ORDER — VILAZODONE HCL 40 MG PO TABS: ORAL_TABLET | ORAL | 0 refills | Status: DC

## 2018-10-27 NOTE — Progress Notes (Signed)
Adam Herrera [id number] 02-28-1980 38 y.o.  Virtual Visit via Video Note  I connected with pt @ on 10/27/18 at 11:30 AM EDT by a video enabled telemedicine application and verified that I am speaking with the correct person using two identifiers.   I discussed the limitations of evaluation and management by telemedicine and the availability of in person appointments. The patient expressed understanding and agreed to proceed.  I discussed the assessment and treatment plan with the patient. The patient was provided an opportunity to ask questions and all were answered. The patient agreed with the plan and demonstrated an understanding of the instructions.   The patient was advised to call back or seek an in-person evaluation if the symptoms worsen or if the condition fails to improve as anticipated.  I provided 20 minutes of non-face-to-face time during this encounter.  The patient was located at home.  The provider was located at [place].   Thayer Headings, PMHNP   Subjective:   Patient ID:  Adam Herrera is a 38 y.o. (DOB 1980/05/03) male.  Chief Complaint:  Chief Complaint  Patient presents with  . Follow-up    Anxiety    HPI Adam Herrera presents for follow-up of anxiety and irritability.  "I think it is getting better." He reports that Viibryd has been expensive and that he and his wife have discussed that benefits seems to outweigh cost.   He reports that he and his wfe have noticed an improvement in his anxiety and irritability. He reports that he is now better able to cope with clutter and children making messes. He reports that he is now able to ask his wife to talk with children about chores and this has helped with family interactions and no longer feels as if he is "always the bad guy." He reports that the clutter and messes are not bothering him as much as it did previously and is able to let some things go. He reports that communication  with wife has improved. He reports an overall improvement in anxiety and anxiety has been manageable overall. Irritability has improved. Denies depressed mood. He denies impaired concentration. He reports improved insomnia. Has been using Trazodone prn and this seemed to help re-set sleep cycle. He reports that his appetite has been stable. He reports that his energy and motivation have been good. Denies SI.   He denies any affective dulling or feeling like a "zombie."  Past Psychiatric Medication Trials:  Seroquel- Reports vivid dreams and nightmares with perceptual disturbances. Helped stabilize mood.  Lexapro- Wt gain, sexual side effects. Helped with anxiety. Reports initially felt very calm, "like a zombie."  Paxil- Ineffective Sertraline- Effective. Initially caused some sleep disturbance. Reports that it did not seem to be as effective over time. Viibryd Trazodone  Review of Systems:  Review of Systems  Gastrointestinal:       Occ diarrhea  Musculoskeletal: Negative for gait problem.  Neurological: Negative for tremors.  Psychiatric/Behavioral:       Please refer to HPI    Medications: I have reviewed the patient's current medications.  Current Outpatient Medications  Medication Sig Dispense Refill  . Multiple Vitamins-Minerals (MULTIVITAMIN GUMMIES ADULT PO) Take by mouth.    . valACYclovir (VALTREX) 1000 MG tablet valacyclovir 1 gram tablet    . Vilazodone HCl (VIIBRYD) 20 MG TABS Take 1 tablet (20 mg total) by mouth daily. 30 tablet 1  . traZODone (DESYREL) 100 MG tablet Take 1/2-1 tablet po QHS prn insomnia  30 tablet 1  . Vilazodone HCl (VIIBRYD) 40 MG TABS Take 1/2-1 tab po qd 90 tablet 0   No current facility-administered medications for this visit.     Medication Side Effects: Other: Occ diarrhea, typically when he takes Viibryd with food.  Allergies:  Allergies  Allergen Reactions  . Doxycycline     Severe headache  . Penicillins Rash    Past Medical  History:  Diagnosis Date  . Anxiety     Family History  Problem Relation Age of Onset  . Anxiety disorder Mother   . Depression Mother   . Alcohol abuse Paternal Grandfather   . Heart attack Paternal Grandfather   . Anxiety disorder Maternal Aunt   . Anxiety disorder Maternal Uncle     Social History   Socioeconomic History  . Marital status: Married    Spouse name: Not on file  . Number of children: Not on file  . Years of education: Not on file  . Highest education level: Not on file  Occupational History  . Not on file  Social Needs  . Financial resource strain: Not on file  . Food insecurity    Worry: Not on file    Inability: Not on file  . Transportation needs    Medical: Not on file    Non-medical: Not on file  Tobacco Use  . Smoking status: Never Smoker  . Smokeless tobacco: Never Used  Substance and Sexual Activity  . Alcohol use: Yes    Comment: Occasional social ETOH use on the weekends  . Drug use: No  . Sexual activity: Not on file  Lifestyle  . Physical activity    Days per week: Not on file    Minutes per session: Not on file  . Stress: Not on file  Relationships  . Social Musicianconnections    Talks on phone: Not on file    Gets together: Not on file    Attends religious service: Not on file    Active member of club or organization: Not on file    Attends meetings of clubs or organizations: Not on file    Relationship status: Not on file  . Intimate partner violence    Fear of current or ex partner: Not on file    Emotionally abused: Not on file    Physically abused: Not on file    Forced sexual activity: Not on file  Other Topics Concern  . Not on file  Social History Narrative  . Not on file    Past Medical History, Surgical history, Social history, and Family history were reviewed and updated as appropriate.   Please see review of systems for further details on the patient's review from today.   Objective:   Physical Exam:  There  were no vitals taken for this visit.  Physical Exam Neurological:     Mental Status: He is alert and oriented to person, place, and time.     Cranial Nerves: No dysarthria.  Psychiatric:        Attention and Perception: Attention normal.        Mood and Affect: Mood normal.        Speech: Speech normal.        Behavior: Behavior is cooperative.        Thought Content: Thought content normal. Thought content is not paranoid or delusional. Thought content does not include homicidal or suicidal ideation. Thought content does not include homicidal or suicidal plan.  Cognition and Memory: Cognition and memory normal.        Judgment: Judgment normal.     Lab Review:     Component Value Date/Time   NA 141 05/05/2012 1528   K 3.9 05/05/2012 1528   CL 105 05/05/2012 1528   CO2 28 05/05/2012 1528   GLUCOSE 92 05/05/2012 1528   BUN 12 05/05/2012 1528   CREATININE 0.89 05/05/2012 1528   CALCIUM 9.3 05/05/2012 1528   GFRNONAA >90 05/05/2012 1528   GFRAA >90 05/05/2012 1528       Component Value Date/Time   WBC 5.6 05/05/2012 1528   RBC 4.51 05/05/2012 1528   HGB 13.7 05/05/2012 1528   HCT 39.3 05/05/2012 1528   PLT 180 05/05/2012 1528   MCV 87.1 05/05/2012 1528   MCH 30.4 05/05/2012 1528   MCHC 34.9 05/05/2012 1528   RDW 12.8 05/05/2012 1528    No results found for: POCLITH, LITHIUM   No results found for: PHENYTOIN, PHENOBARB, VALPROATE, CBMZ   .res Assessment: Plan:   Patient seen for 20 minutes and greater than 50% of visit spent counseling patient and coordination of care. Patient reports that Viibryd 20 mg daily is working well for anxiety signs and symptoms.  He reports some concerns about cost however feels at this time the benefits outweigh cost.  Discussed that often Viibryd 90-day supplies were significantly cheaper than a 30-day supply and recommend trying to fill a 90-day supply. Discussed that script can also be sent for the 40 mg tabs with instructions to  take 1/2-1 tab po qd in case dose were increased in the future and to decrease cost. Recommended that pt contact clinic if cost is still high, and office staff could then contact pharmaceutical representative for assistance.  Pt prefers to f/u in 1 month to determine that he continues to experience benefit with Viibryd. Patient advised to contact office with any questions, adverse effects, or acute worsening in signs and symptoms. Discussed continuing to use trazodone on an as-needed basis and discussed that often taking trazodone for a few consecutive nights can help stop cycles of insomnia.  Patient reports that he has not been needing to take trazodone in the last 2 weeks since trazodone was helpful with improving acute insomnia and he will consider resuming trazodone if insomnia occurs again. Patient advised to contact office with any questions, adverse effects, or acute worsening in signs and symptoms.  Adam Herrera was seen today for follow-up.  Diagnoses and all orders for this visit:  Mixed obsessional thoughts and acts -     Vilazodone HCl (VIIBRYD) 40 MG TABS; Take 1/2-1 tab po qd  Generalized anxiety disorder -     Vilazodone HCl (VIIBRYD) 40 MG TABS; Take 1/2-1 tab po qd  Insomnia due to other mental disorder     Please see After Visit Summary for patient specific instructions.  No future appointments.  No orders of the defined types were placed in this encounter.     -------------------------------

## 2018-11-28 ENCOUNTER — Ambulatory Visit: Payer: BC Managed Care – PPO | Admitting: Psychiatry

## 2019-05-11 ENCOUNTER — Other Ambulatory Visit: Payer: Self-pay

## 2019-05-11 ENCOUNTER — Encounter: Payer: Self-pay | Admitting: Psychiatry

## 2019-05-11 ENCOUNTER — Ambulatory Visit (INDEPENDENT_AMBULATORY_CARE_PROVIDER_SITE_OTHER): Payer: BC Managed Care – PPO | Admitting: Psychiatry

## 2019-05-11 DIAGNOSIS — F422 Mixed obsessional thoughts and acts: Secondary | ICD-10-CM

## 2019-05-11 DIAGNOSIS — F411 Generalized anxiety disorder: Secondary | ICD-10-CM

## 2019-05-11 DIAGNOSIS — F99 Mental disorder, not otherwise specified: Secondary | ICD-10-CM | POA: Diagnosis not present

## 2019-05-11 DIAGNOSIS — F5105 Insomnia due to other mental disorder: Secondary | ICD-10-CM

## 2019-05-11 NOTE — Progress Notes (Signed)
DMONTE MAHER [id number] Jul 11, 1980 39 y.o.  Subjective:   Patient ID:  Adam Herrera is a 39 y.o. (DOB February 02, 1980) male.  Chief Complaint:  Chief Complaint  Patient presents with  . Other    Irritability  . Follow-up    h/o Anxiety and Irritability    HPI Adam Herrera presents to the office today for follow-up of depression and anxiety. He reports that he has continued taking Viibryd and is concerned about cost. He reports that his wife feels that Viibryd has been effective. He feels it has been helpful for the "extreme outbursts" where has difficulty controlling what he is saying. He reports "everything agitates me" at home and work. He reports that he tries to hide his annoyance with co-workers. "I don't have a bad life, so I shouldn't be agitated all the time." He stopped watching the news since it now agitates him, even though he has enjoyed keeping up with current events in the past. No longer watches sports "because they make that political too." He reports that he continues to like things neat and clean. He reports that he does not get "as mad" or frustrated with clutter at home. Denies checking behaviors. Anxiety has been ok. Denies panic. Denies depressed mood. Sleeping well with new bed. Appetite has been fine. He reports that his energy has been good. Has not been able to work out regularly with the pandemic and has not started going back to the gym. He reports adequate motivation. He reports that on his days off he will do multiple tasks and projects. Concentration has been adequate. He reports that he decided to use vacation time to take a long weekend every other week. Denies SI.   Past Psychiatric Medication Trials:  Seroquel- Reports vivid dreams and nightmares with perceptual disturbances. Helped stabilize mood.  Lexapro- Wt gain, sexual side effects. Helped with anxiety. Reports initially felt very calm, "like a zombie."  Paxil- Ineffective Sertraline-  Effective. Initially caused some sleep disturbance. Reports that it did not seem to be as effective over time. Viibryd Trazodone  PHQ2-9     Office Visit from 12/28/2014 in Primary Care at [facility] Total Score  0       Review of Systems:  Review of Systems  Gastrointestinal:       Occ diarrhea  Musculoskeletal: Negative for gait problem.  Neurological: Negative for tremors and headaches.  Psychiatric/Behavioral:       Please refer to HPI    Medications: I have reviewed the patient's current medications.  Current Outpatient Medications  Medication Sig Dispense Refill  . Multiple Vitamins-Minerals (MULTIVITAMIN GUMMIES ADULT PO) Take by mouth.    . valACYclovir (VALTREX) 1000 MG tablet valacyclovir 1 gram tablet    . Vilazodone HCl (VIIBRYD) 20 MG TABS Take 1 tablet (20 mg total) by mouth daily. 30 tablet 1  . Vilazodone HCl (VIIBRYD) 40 MG TABS Take 1/2-1 tab po qd 90 tablet 0   No current facility-administered medications for this visit.    Medication Side Effects: Other: Occ diarrhea  Allergies:  Allergies  Allergen Reactions  . Doxycycline     Severe headache  . Penicillins Rash    Past Medical History:  Diagnosis Date  . Anxiety     Family History  Problem Relation Age of Onset  . Anxiety disorder Mother   . Depression Mother   . Alcohol abuse Paternal Grandfather   . Heart attack Paternal Grandfather   . Anxiety disorder Maternal Aunt   .  Anxiety disorder Maternal Uncle     Social History   Socioeconomic History  . Marital status: Married    Spouse name: Not on file  . Number of children: Not on file  . Years of education: Not on file  . Highest education level: Not on file  Occupational History  . Not on file  Tobacco Use  . Smoking status: Never Smoker  . Smokeless tobacco: Never Used  Substance and Sexual Activity  . Alcohol use: Yes    Comment: Occasional social ETOH use on the weekends  . Drug use: No  . Sexual activity: Not on  file  Other Topics Concern  . Not on file  Social History Narrative  . Not on file   Social Determinants of Health   Financial Resource Strain:   . Difficulty of Paying Living Expenses:   Food Insecurity:   . Worried About Charity fundraiser in the Last Year:   . Arboriculturist in the Last Year:   Transportation Needs:   . Film/video editor (Medical):   Marland Kitchen Lack of Transportation (Non-Medical):   Physical Activity:   . Days of Exercise per Week:   . Minutes of Exercise per Session:   Stress:   . Feeling of Stress :   Social Connections:   . Frequency of Communication with Friends and Family:   . Frequency of Social Gatherings with Friends and Family:   . Attends Religious Services:   . Active Member of Clubs or Organizations:   . Attends Archivist Meetings:   Marland Kitchen Marital Status:   Intimate Partner Violence:   . Fear of Current or Ex-Partner:   . Emotionally Abused:   Marland Kitchen Physically Abused:   . Sexually Abused:     Past Medical History, Surgical history, Social history, and Family history were reviewed and updated as appropriate.   Please see review of systems for further details on the patient's review from today.   Objective:   Physical Exam:  There were no vitals taken for this visit.  Physical Exam Constitutional:      General: He is not in acute distress. Musculoskeletal:        General: No deformity.  Neurological:     Mental Status: He is alert and oriented to person, place, and time.     Coordination: Coordination normal.  Psychiatric:        Attention and Perception: Attention and perception normal. He does not perceive auditory or visual hallucinations.        Mood and Affect: Mood is anxious. Mood is not depressed. Affect is not labile, blunt, angry or inappropriate.        Speech: Speech normal.        Behavior: Behavior normal.        Thought Content: Thought content normal. Thought content is not paranoid or delusional. Thought content  does not include homicidal or suicidal ideation. Thought content does not include homicidal or suicidal plan.        Cognition and Memory: Cognition and memory normal.        Judgment: Judgment normal.     Comments: Insight intact     Lab Review:     Component Value Date/Time   NA 141 05/05/2012 1528   K 3.9 05/05/2012 1528   CL 105 05/05/2012 1528   CO2 28 05/05/2012 1528   GLUCOSE 92 05/05/2012 1528   BUN 12 05/05/2012 1528   CREATININE 0.89 05/05/2012 1528  CALCIUM 9.3 05/05/2012 1528   GFRNONAA >90 05/05/2012 1528   GFRAA >90 05/05/2012 1528       Component Value Date/Time   WBC 5.6 05/05/2012 1528   RBC 4.51 05/05/2012 1528   HGB 13.7 05/05/2012 1528   HCT 39.3 05/05/2012 1528   PLT 180 05/05/2012 1528   MCV 87.1 05/05/2012 1528   MCH 30.4 05/05/2012 1528   MCHC 34.9 05/05/2012 1528   RDW 12.8 05/05/2012 1528    No results found for: POCLITH, LITHIUM   No results found for: PHENYTOIN, PHENOBARB, VALPROATE, CBMZ   .res Assessment: Plan:   Discussed concerns re: cost of Viibryd. Will request that office staff assist with confirming that pharmacy is submitting script through his insurance and applying both insurance and the copay savings card. Discussed increasing Viibryd if cost is more affordable. Discussed switching back to Sertraline and increasing to 200 mg po qd for further stabilization in s/s since pt has not taken more than 150 mg in the past. Discussed that provider or office staff would call with response after following-up on cost of medication and would provide instructions re: cross-titration if Viibryd cost cannot be decreased. Pt to f/u in 8 weeks or sooner if clinically indicated.  Patient advised to contact office with any questions, adverse effects, or acute worsening in signs and symptoms.  Tarus was seen today for other and follow-up.  Diagnoses and all orders for this visit:  Mixed obsessional thoughts and acts  Generalized anxiety  disorder  Insomnia due to other mental disorder     Please see After Visit Summary for patient specific instructions.  Future Appointments  Date Time Provider Department Center  07/06/2019 11:00 AM Corie Chiquito, PMHNP [facility] None    No orders of the defined types were placed in this encounter.   -------------------------------

## 2019-05-12 ENCOUNTER — Telehealth: Payer: Self-pay | Admitting: Psychiatry

## 2019-05-12 DIAGNOSIS — F422 Mixed obsessional thoughts and acts: Secondary | ICD-10-CM

## 2019-05-12 DIAGNOSIS — F411 Generalized anxiety disorder: Secondary | ICD-10-CM

## 2019-05-12 MED ORDER — VILAZODONE HCL 40 MG PO TABS: 40.0000 mg | ORAL_TABLET | Freq: Every day | ORAL | 0 refills | Status: DC

## 2019-05-12 NOTE — Telephone Encounter (Signed)
Script sent for Viibryd 

## 2019-05-14 ENCOUNTER — Telehealth: Payer: Self-pay | Admitting: Psychiatry

## 2019-05-14 NOTE — Telephone Encounter (Signed)
VIIBRYD Prior Auth denied by [employer].  Case # [id number]  Denial reason: All other lower tiered drugs did not work well to treat medical condition. These drugs include FLUVOXAMINE, CLOMIPRAMINE, DULOXETINE, or VENLAFAXINE.

## 2019-06-26 NOTE — Telephone Encounter (Signed)
Patient's appeal for a tier cost reduction is still in process. If we have any NEW information to submit that would also be helpful.

## 2019-06-29 ENCOUNTER — Encounter: Payer: Self-pay | Admitting: Psychiatry

## 2019-06-29 ENCOUNTER — Ambulatory Visit (INDEPENDENT_AMBULATORY_CARE_PROVIDER_SITE_OTHER): Payer: BC Managed Care – PPO | Admitting: Psychiatry

## 2019-06-29 ENCOUNTER — Other Ambulatory Visit: Payer: Self-pay

## 2019-06-29 VITALS — BP 117/82 | HR 60 | Wt 184.0 lb

## 2019-06-29 DIAGNOSIS — F422 Mixed obsessional thoughts and acts: Secondary | ICD-10-CM | POA: Diagnosis not present

## 2019-06-29 DIAGNOSIS — F411 Generalized anxiety disorder: Secondary | ICD-10-CM | POA: Diagnosis not present

## 2019-06-29 MED ORDER — VILAZODONE HCL 40 MG PO TABS: 40.0000 mg | ORAL_TABLET | Freq: Every day | ORAL | 0 refills | Status: DC

## 2019-06-29 MED ORDER — FLUOXETINE HCL 20 MG PO CAPS
ORAL_CAPSULE | ORAL | 1 refills | Status: DC
Start: 2019-06-29 — End: 2019-08-18

## 2019-06-29 NOTE — Patient Instructions (Signed)
Take Viibryd 30 mg daily for one week (while starting Fluoxetine), then decrease to 20 mg daily for one week, then decrease to 10 mg daily for one week, then stop.   Start Prozac (Fluoxetine) 20 mg daily in the morning for one week, then increase to 2 capsules (40 mg) daily.  Call with any questions or side effects.

## 2019-06-29 NOTE — Progress Notes (Signed)
Adam Herrera [id number] 03/18/1980 39 y.o.  Subjective:   Patient ID:  Adam Herrera is a 39 y.o. (DOB [birth year]) male.  Chief Complaint:  Chief Complaint  Patient presents with  . Follow-up    h/o Anxiety, Depression    HPI [name] presents to the office today for follow-up of anxiety and depression. He reports that Viibryd remains cost-prohibitive and would like to change medication. He reports that he had chest pain off and on for a week and is not sure if it was related to stress or starting to exercise again. He was seen in urgent care. He has not had any chest pain this past week. He reports that he had cholesterol panel that showed borderline high cholesterol and he made some significant changes in his diet and eliminated fried foods and "junk food." He reports that he noticed more "patience" on Viibryd 40 mg po qd. Had some anxiety when he was having health issues. Denies depressed mood. Sleeping well. Appetite has been stable. Energy and motivation have been good. Denies impaired concentration. Denies SI.   Past Psychiatric Medication Trials:  Seroquel- Reports vivid dreams and nightmares with perceptual disturbances. Helped stabilize mood.  Lexapro- Wt gain, sexual side effects. Helped with anxiety. Reports initially felt very calm, "like a zombie."  Paxil- Ineffective Sertraline- Effective. Initially caused some sleep disturbance. Reports that it did not seem to be as effective over time. Viibryd- Effective, cost prohibitive Trazodone   PHQ2-9     Office Visit from 12/28/2014 in Primary Care at [facility] Total Score 0       Review of Systems:  Review of Systems  Cardiovascular: Negative for chest pain.  Gastrointestinal: Negative.   Musculoskeletal: Negative for gait problem.  Neurological: Negative for tremors.  Psychiatric/Behavioral:       Please refer to HPI    Medications: I have reviewed the patient's current  medications.  Current Outpatient Medications  Medication Sig Dispense Refill  . Multiple Vitamins-Minerals (MULTIVITAMIN GUMMIES ADULT PO) Take by mouth.    . valACYclovir (VALTREX) 1000 MG tablet valacyclovir 1 gram tablet    . Vilazodone HCl (VIIBRYD) 40 MG TABS Take 1 tablet (40 mg total) by mouth daily. Decrease to 30 mg daily for one week, then 20 mg daily for one week, then 10 mg daily for one week, then stop. 90 tablet 0  . FLUoxetine (PROZAC) 20 MG capsule Take 1 capsule po qd for one week, then increase to 2 capsules po qd 60 capsule 1   No current facility-administered medications for this visit.    Medication Side Effects: None  Allergies:  Allergies  Allergen Reactions  . Doxycycline     Severe headache  . Penicillins Rash    Past Medical History:  Diagnosis Date  . Anxiety     Family History  Problem Relation Age of Onset  . Anxiety disorder Mother   . Depression Mother   . Alcohol abuse Paternal Grandfather   . Heart attack Paternal Grandfather   . Anxiety disorder Maternal Aunt   . Anxiety disorder Maternal Uncle     Social History   Socioeconomic History  . Marital status: Married    Spouse name: Not on file  . Number of children: Not on file  . Years of education: Not on file  . Highest education level: Not on file  Occupational History  . Not on file  Tobacco Use  . Smoking status: Never Smoker  . Smokeless tobacco:  Never Used  Substance and Sexual Activity  . Alcohol use: Yes    Comment: Occasional social ETOH use on the weekends  . Drug use: No  . Sexual activity: Not on file  Other Topics Concern  . Not on file  Social History Narrative  . Not on file   Social Determinants of Health   Financial Resource Strain:   . Difficulty of Paying Living Expenses:   Food Insecurity:   . Worried About Programme researcher, broadcasting/film/video in the Last Year:   . Barista in the Last Year:   Transportation Needs:   . Freight forwarder (Medical):   Marland Kitchen  Lack of Transportation (Non-Medical):   Physical Activity:   . Days of Exercise per Week:   . Minutes of Exercise per Session:   Stress:   . Feeling of Stress :   Social Connections:   . Frequency of Communication with Friends and Family:   . Frequency of Social Gatherings with Friends and Family:   . Attends Religious Services:   . Active Member of Clubs or Organizations:   . Attends Banker Meetings:   Marland Kitchen Marital Status:   Intimate Partner Violence:   . Fear of Current or Ex-Partner:   . Emotionally Abused:   Marland Kitchen Physically Abused:   . Sexually Abused:     Past Medical History, Surgical history, Social history, and Family history were reviewed and updated as appropriate.   Please see review of systems for further details on the patient's review from today.   Objective:   Physical Exam:  BP 117/82   Pulse 60   Wt 184 lb (83.5 kg)   BMI 27.98 kg/m   Physical Exam Constitutional:      General: He is not in acute distress. Musculoskeletal:        General: No deformity.  Neurological:     Mental Status: He is alert and oriented to person, place, and time.     Coordination: Coordination normal.  Psychiatric:        Attention and Perception: Attention and perception normal. He does not perceive auditory or visual hallucinations.        Mood and Affect: Mood normal. Mood is not anxious or depressed. Affect is not labile, blunt, angry or inappropriate.        Speech: Speech normal.        Behavior: Behavior normal.        Thought Content: Thought content normal. Thought content is not paranoid or delusional. Thought content does not include homicidal or suicidal ideation. Thought content does not include homicidal or suicidal plan.        Cognition and Memory: Cognition and memory normal.        Judgment: Judgment normal.     Comments: Insight intact     Lab Review:     Component Value Date/Time   NA 141 05/05/2012 1528   K 3.9 05/05/2012 1528   CL 105  05/05/2012 1528   CO2 28 05/05/2012 1528   GLUCOSE 92 05/05/2012 1528   BUN 12 05/05/2012 1528   CREATININE 0.89 05/05/2012 1528   CALCIUM 9.3 05/05/2012 1528   GFRNONAA >90 05/05/2012 1528   GFRAA >90 05/05/2012 1528       Component Value Date/Time   WBC 5.6 05/05/2012 1528   RBC 4.51 05/05/2012 1528   HGB 13.7 05/05/2012 1528   HCT 39.3 05/05/2012 1528   PLT 180 05/05/2012 1528   MCV 87.1  05/05/2012 1528   MCH 30.4 05/05/2012 1528   MCHC 34.9 05/05/2012 1528   RDW 12.8 05/05/2012 1528    No results found for: POCLITH, LITHIUM   No results found for: PHENYTOIN, PHENOBARB, VALPROATE, CBMZ   .res Assessment: Plan:   Patient reports that he would like to switch to another medication instead of Viibryd since he has been having difficulties with cost and insurance will.  Discussed several other possible treatment options, to include resuming sertraline and increasing to 200 mg dose since 150 mg daily was partially effective in the past.  Also discussed potential benefits, risks, and side effects of Prozac since Prozac has indication for OCD.  Patient agrees to trial of Prozac. Will start Prozac 20 mg daily for 1 week, then increase to 40 mg daily for anxiety and depression. Will decrease Viibryd to 30 mg daily for 1 week, then 20 mg daily for 1 week, then 10 mg daily for 1 week, then stop. Patient to follow-up in 5 weeks or sooner if clinically indicated. Patient advised to contact office with any questions, adverse effects, or acute worsening in signs and symptoms.  Othello was seen today for follow-up.  Diagnoses and all orders for this visit:  Generalized anxiety disorder -     FLUoxetine (PROZAC) 20 MG capsule; Take 1 capsule po qd for one week, then increase to 2 capsules po qd -     Vilazodone HCl (VIIBRYD) 40 MG TABS; Take 1 tablet (40 mg total) by mouth daily. Decrease to 30 mg daily for one week, then 20 mg daily for one week, then 10 mg daily for one week, then  stop.  Mixed obsessional thoughts and acts -     FLUoxetine (PROZAC) 20 MG capsule; Take 1 capsule po qd for one week, then increase to 2 capsules po qd -     Vilazodone HCl (VIIBRYD) 40 MG TABS; Take 1 tablet (40 mg total) by mouth daily. Decrease to 30 mg daily for one week, then 20 mg daily for one week, then 10 mg daily for one week, then stop.     Please see After Visit Summary for patient specific instructions.  Future Appointments  Date Time Provider Department Center  08/04/2019  4:30 PM Corie Chiquito, PMHNP [facility] None    No orders of the defined types were placed in this encounter.   -------------------------------

## 2019-07-06 ENCOUNTER — Ambulatory Visit: Payer: BC Managed Care – PPO | Admitting: Psychiatry

## 2019-08-04 ENCOUNTER — Ambulatory Visit: Payer: BC Managed Care – PPO | Admitting: Psychiatry

## 2019-08-18 ENCOUNTER — Encounter: Payer: Self-pay | Admitting: Psychiatry

## 2019-08-18 ENCOUNTER — Ambulatory Visit (INDEPENDENT_AMBULATORY_CARE_PROVIDER_SITE_OTHER): Payer: BC Managed Care – PPO | Admitting: Psychiatry

## 2019-08-18 ENCOUNTER — Other Ambulatory Visit: Payer: Self-pay

## 2019-08-18 DIAGNOSIS — F411 Generalized anxiety disorder: Secondary | ICD-10-CM | POA: Diagnosis not present

## 2019-08-18 DIAGNOSIS — F422 Mixed obsessional thoughts and acts: Secondary | ICD-10-CM | POA: Diagnosis not present

## 2019-08-18 MED ORDER — FLUOXETINE HCL 20 MG PO CAPS: ORAL_CAPSULE | ORAL | 1 refills | Status: DC

## 2019-08-18 NOTE — Progress Notes (Signed)
TEAL BONTRAGER [id number] 1980-10-02 39 y.o.  Subjective:   Patient ID:  Adam Herrera is a 39 y.o. (DOB 14-Mar-1980) male.  Chief Complaint:  Chief Complaint  Patient presents with   Follow-up    h/o Anxiety    HPI Adam Herrera presents to the office today for follow-up of anxiety and depression. He reports that Prozac has been effective for anxiety and irritability at home. He reports that he is frequently annoyed at work with certain customer questions and demands. He reports that he is able to remain calm in these situations. He reports that he continues to try to help de-clutter at home. He reports that he now will try to de-clutter and clean items instead of expecting wife to maintain his same standards. He reports that he has not been getting angry or irritated at home. Denies anxiety attacks. Denies depressed mood. Denies affective dulling. He reports that his patience is about the same on Prozac compared to other medications. Energy and motivation have been good. Sleeping well. Appetite is good. Intentionally lost wt. Concentration is adequate. Denies SI.   Denies any difficulty with discontinuation s/s with cross-titration.  Past Psychiatric Medication Trials:  Seroquel- Reports vivid dreams and nightmares with perceptual disturbances. Helped stabilize mood.  Lexapro- Wt gain, sexual side effects. Helped with anxiety. Reports initially felt very calm, "like a zombie."  Paxil- Ineffective Sertraline- Effective. Initially caused some sleep disturbance. Reports that it did not seem to be as effective over time. Viibryd- Effective, cost prohibitive Trazodone  PHQ2-9     Office Visit from 12/28/2014 in Primary Care at [facility] Total Score 0       Review of Systems:  Review of Systems  Musculoskeletal: Negative for gait problem.  Neurological: Negative for tremors.  Psychiatric/Behavioral:       Please refer to HPI    Medications: I have reviewed  the patient's current medications.  Current Outpatient Medications  Medication Sig Dispense Refill   atorvastatin (LIPITOR) 40 MG tablet Take 40 mg by mouth daily.     ergocalciferol (VITAMIN D2) 1.25 MG (50000 UT) capsule Vitamin D2 1,250 mcg (50,000 unit) capsule  Take 1 capsule every week by oral route.     FLUoxetine (PROZAC) 20 MG capsule Take 2-3 capsules po qd 270 capsule 1   valACYclovir (VALTREX) 1000 MG tablet Take 1,000 mg by mouth daily.      Multiple Vitamins-Minerals (MULTIVITAMIN GUMMIES ADULT PO) Take by mouth. (Patient not taking: Reported on 08/18/2019)     No current facility-administered medications for this visit.    Medication Side Effects: Other: Notices occ muscle twitch when laying down  Allergies:  Allergies  Allergen Reactions   Doxycycline     Severe headache   Penicillins Rash    Past Medical History:  Diagnosis Date   Anxiety    Elevated cholesterol    Vitamin D deficiency     Family History  Problem Relation Age of Onset   Anxiety disorder Mother    Depression Mother    Alcohol abuse Paternal Grandfather    Heart attack Paternal Grandfather    Anxiety disorder Maternal Aunt    Anxiety disorder Maternal Uncle     Social History   Socioeconomic History   Marital status: Married    Spouse name: Not on file   Number of children: Not on file   Years of education: Not on file   Highest education level: Not on file  Occupational History  Not on file  Tobacco Use   Smoking status: Never Smoker   Smokeless tobacco: Never Used  Substance and Sexual Activity   Alcohol use: Yes    Comment: Occasional social ETOH use on the weekends   Drug use: No   Sexual activity: Not on file  Other Topics Concern   Not on file  Social History Narrative   Not on file   Social Determinants of Health   Financial Resource Strain:    Difficulty of Paying Living Expenses:   Food Insecurity:    Worried About Patent examiner in the Last Year:    Barista in the Last Year:   Transportation Needs:    Freight forwarder (Medical):    Lack of Transportation (Non-Medical):   Physical Activity:    Days of Exercise per Week:    Minutes of Exercise per Session:   Stress:    Feeling of Stress :   Social Connections:    Frequency of Communication with Friends and Family:    Frequency of Social Gatherings with Friends and Family:    Attends Religious Services:    Active Member of Clubs or Organizations:    Attends Engineer, structural:    Marital Status:   Intimate Partner Violence:    Fear of Current or Ex-Partner:    Emotionally Abused:    Physically Abused:    Sexually Abused:     Past Medical History, Surgical history, Social history, and Family history were reviewed and updated as appropriate.   Please see review of systems for further details on the patient's review from today.   Objective:   Physical Exam:  Wt 175 lb (79.4 kg)    BMI 26.61 kg/m   Physical Exam Constitutional:      General: He is not in acute distress. Musculoskeletal:        General: No deformity.  Neurological:     Mental Status: He is alert and oriented to person, place, and time.     Coordination: Coordination normal.  Psychiatric:        Attention and Perception: Attention and perception normal. He does not perceive auditory or visual hallucinations.        Mood and Affect: Mood normal. Mood is not anxious or depressed. Affect is not labile, blunt, angry or inappropriate.        Speech: Speech normal.        Behavior: Behavior normal.        Thought Content: Thought content normal. Thought content is not paranoid or delusional. Thought content does not include homicidal or suicidal ideation. Thought content does not include homicidal or suicidal plan.        Cognition and Memory: Cognition and memory normal.        Judgment: Judgment normal.     Comments: Insight intact      Lab Review:     Component Value Date/Time   NA 141 05/05/2012 1528   K 3.9 05/05/2012 1528   CL 105 05/05/2012 1528   CO2 28 05/05/2012 1528   GLUCOSE 92 05/05/2012 1528   BUN 12 05/05/2012 1528   CREATININE 0.89 05/05/2012 1528   CALCIUM 9.3 05/05/2012 1528   GFRNONAA >90 05/05/2012 1528   GFRAA >90 05/05/2012 1528       Component Value Date/Time   WBC 5.6 05/05/2012 1528   RBC 4.51 05/05/2012 1528   HGB 13.7 05/05/2012 1528   HCT 39.3 05/05/2012 1528  PLT 180 05/05/2012 1528   MCV 87.1 05/05/2012 1528   MCH 30.4 05/05/2012 1528   MCHC 34.9 05/05/2012 1528   RDW 12.8 05/05/2012 1528    No results found for: POCLITH, LITHIUM   No results found for: PHENYTOIN, PHENOBARB, VALPROATE, CBMZ   .res Assessment: Plan:   Discussed potential benefits, risks, and side effects of increasing Prozac to 60 mg daily.  Discussed starting trial of Prozac 60 mg to determine if this is helpful for his irritability at work.  Discussed that he could resume 40 mg daily if there is no significant improvement with increase to 60 mg dose or if side effects occur. Patient to follow-up in 6 months or sooner if clinically indicated. Patient advised to contact office with any questions, adverse effects, or acute worsening in signs and symptoms.  Kean was seen today for follow-up.  Diagnoses and all orders for this visit:  Generalized anxiety disorder -     FLUoxetine (PROZAC) 20 MG capsule; Take 2-3 capsules po qd  Mixed obsessional thoughts and acts -     FLUoxetine (PROZAC) 20 MG capsule; Take 2-3 capsules po qd     Please see After Visit Summary for patient specific instructions.  Future Appointments  Date Time Provider Department Center  09/04/2019  2:00 PM Patwardhan, Anabel Bene, MD [facility] None    No orders of the defined types were placed in this encounter.   -------------------------------

## 2019-09-04 ENCOUNTER — Other Ambulatory Visit: Payer: Self-pay

## 2019-09-04 ENCOUNTER — Encounter: Payer: Self-pay | Admitting: Cardiology

## 2019-09-04 ENCOUNTER — Ambulatory Visit: Payer: BC Managed Care – PPO | Admitting: Cardiology

## 2019-09-04 VITALS — BP 125/84 | HR 63 | Resp 16 | Ht 68.0 in | Wt 184.0 lb

## 2019-09-04 DIAGNOSIS — E782 Mixed hyperlipidemia: Secondary | ICD-10-CM

## 2019-09-04 DIAGNOSIS — I208 Other forms of angina pectoris: Secondary | ICD-10-CM

## 2019-09-04 DIAGNOSIS — I209 Angina pectoris, unspecified: Secondary | ICD-10-CM | POA: Insufficient documentation

## 2019-09-04 MED ORDER — METOPROLOL TARTRATE 25 MG PO TABS: 25.0000 mg | ORAL_TABLET | Freq: Two times a day (BID) | ORAL | 3 refills | Status: DC

## 2019-09-04 MED ORDER — NITROGLYCERIN 0.4 MG SL SUBL: 0.4000 mg | SUBLINGUAL_TABLET | SUBLINGUAL | 3 refills | Status: AC | PRN

## 2019-09-04 MED ORDER — ASPIRIN EC 81 MG PO TBEC: 81.0000 mg | DELAYED_RELEASE_TABLET | Freq: Every day | ORAL | 3 refills | Status: DC

## 2019-09-04 NOTE — Progress Notes (Signed)
Patient referred by Lindaann Pascal, PA-C for chest pain  Subjective:   Adam Herrera, male    DOB: 1980-06-15, 39 y.o.   MRN: [id number]   Chief Complaint  Patient presents with  . Chest Pain  . New Patient (Initial Visit)     HPI  39 y.o. Caucasian male with hyperlipidemia, h/o coiling for patent ductus arteriosus (at age 87, now with angina  Patient works at [employer]. He is active at baseline, with regular elliptical workout and weight training. For last several weeks, he has had symptoms of left sided pain with exertion,. Lasting for a few min, relieved with rest.   Past Medical History:  Diagnosis Date  . Anxiety   . Elevated cholesterol   . Vitamin D deficiency      Past Surgical History:  Procedure Laterality Date  . APPENDECTOMY    . CARDIAC SURGERY     Coiling of patent ductus arteriosus around 2000     Social History   Tobacco Use  Smoking Status Never Smoker  Smokeless Tobacco Never Used    Social History   Substance and Sexual Activity  Alcohol Use Yes   Comment: Occasional social ETOH use on the weekends     Family History  Problem Relation Age of Onset  . Anxiety disorder Mother   . Depression Mother   . Alcohol abuse Paternal Grandfather   . Heart attack Paternal Grandfather   . Anxiety disorder Maternal Aunt   . Anxiety disorder Maternal Uncle      Current Outpatient Medications on File Prior to Visit  Medication Sig Dispense Refill  . atorvastatin (LIPITOR) 40 MG tablet Take 40 mg by mouth daily.    . ergocalciferol (VITAMIN D2) 1.25 MG (50000 UT) capsule Vitamin D2 1,250 mcg (50,000 unit) capsule  Take 1 capsule every week by oral route.    Marland Kitchen FLUoxetine (PROZAC) 20 MG capsule Take 2-3 capsules po qd 270 capsule 1  . Multiple Vitamins-Minerals (MULTIVITAMIN GUMMIES ADULT PO) Take by mouth. (Patient not taking: Reported on 08/18/2019)    . valACYclovir (VALTREX) 1000 MG tablet Take 1,000 mg by mouth daily.      No current  facility-administered medications on file prior to visit.    Cardiovascular and other pertinent studies:   EKG 09/04/2019: Sinus rhythm 63 bpm Normal EKG   Recent labs: 08/10/2019: Chol 225, TG 133, HDL 46, LDL 153    Review of Systems  Cardiovascular: Positive for chest pain. Negative for dyspnea on exertion, leg swelling, palpitations and syncope.         Vitals:   09/04/19 1335 09/04/19 1339  BP: (!) 141/96 125/84  Pulse: 74 63  Resp: 16   SpO2: 100%      Body mass index is 27.98 kg/m. Filed Weights   09/04/19 1335  Weight: 184 lb (83.5 kg)     Objective:   Physical Exam Vitals and nursing note reviewed.  Constitutional:      General: He is not in acute distress. Neck:     Vascular: No JVD.  Cardiovascular:     Rate and Rhythm: Normal rate and regular rhythm.     Heart sounds: Normal heart sounds. No murmur heard.   Pulmonary:     Effort: Pulmonary effort is normal.     Breath sounds: Normal breath sounds. No wheezing or rales.            Assessment & Recommendations:   39 y.o. Caucasian male with hyperlipidemia, h/o  coiling for patent ductus arteriosus (at age 3, now with angina  Angina: Started Aspirin 81, metoprolol tartarate 25 mg bid, as needed nitro. Continue lipitor 40 mg. Will obtain coronary CTA.  I discussed with the patient regarding the risks of Covid infection, hospitalization, and death.  I strongly recommended getting vaccinated against Covid as a safe and FDA approved measure to reduce risk of infection, hospitalization, as well as death.      Thank you for referring the patient to [place]. Please feel free to contact with any questions.   Elder Negus, MD Pager: [phone] Office: [phone]

## 2019-09-11 ENCOUNTER — Other Ambulatory Visit: Payer: Self-pay

## 2019-09-15 ENCOUNTER — Telehealth: Payer: Self-pay

## 2019-09-16 ENCOUNTER — Other Ambulatory Visit (HOSPITAL_COMMUNITY): Payer: Self-pay | Admitting: Cardiology

## 2019-09-17 LAB — BASIC METABOLIC PANEL
BUN/Creatinine Ratio: 18 (ref 9–20)
BUN: 16 mg/dL (ref 6–24)
CO2: 27 mmol/L (ref 20–29)
Calcium: 9.7 mg/dL (ref 8.7–10.2)
Chloride: 104 mmol/L (ref 96–106)
Creatinine, Ser: 0.88 mg/dL (ref 0.76–1.27)
GFR calc Af Amer: 124 mL/min/{1.73_m2} (ref >59)
GFR calc non Af Amer: 107 mL/min/{1.73_m2} (ref >59)
Glucose: 91 mg/dL (ref 65–99)
Potassium: 4.8 mmol/L (ref 3.5–5.2)
Sodium: 142 mmol/L (ref 134–144)

## 2019-09-22 ENCOUNTER — Telehealth (HOSPITAL_COMMUNITY): Payer: Self-pay | Admitting: Emergency Medicine

## 2019-09-22 NOTE — Telephone Encounter (Signed)
Attempted to call patient regarding upcoming cardiac CT appointment. °Left message on voicemail with name and callback number °Kamron Vanwyhe RN Navigator Cardiac Imaging °[facility] °[phone] Office °[phone] Cell ° °

## 2019-09-23 ENCOUNTER — Other Ambulatory Visit: Payer: Self-pay

## 2019-09-23 ENCOUNTER — Ambulatory Visit (HOSPITAL_COMMUNITY)
Admission: RE | Admit: 2019-09-23 | Discharge: 2019-09-23 | Disposition: A | Discharge status: Home / Self Care | Payer: BC Managed Care – PPO | Source: Ambulatory Visit | Attending: Cardiology | Admitting: Cardiology

## 2019-09-23 DIAGNOSIS — I209 Angina pectoris, unspecified: Secondary | ICD-10-CM | POA: Insufficient documentation

## 2019-09-23 DIAGNOSIS — I208 Other forms of angina pectoris: Secondary | ICD-10-CM

## 2019-09-23 MED ORDER — IOHEXOL 350 MG/ML SOLN
80.0000 mL | Freq: Once | INTRAVENOUS | Status: AC | PRN
  Administered 2019-09-23: 10:00:00 80 mL via INTRAVENOUS

## 2019-09-23 MED ORDER — NITROGLYCERIN 0.4 MG SL SUBL
0.8000 mg | SUBLINGUAL_TABLET | Freq: Once | SUBLINGUAL | Status: AC
  Administered 2019-09-23: 10:00:00 0.8 mg via SUBLINGUAL

## 2019-09-23 MED ORDER — NITROGLYCERIN 0.4 MG SL SUBL
SUBLINGUAL_TABLET | SUBLINGUAL | Status: AC
  Filled 2019-09-23: qty 2

## 2019-09-25 ENCOUNTER — Other Ambulatory Visit (HOSPITAL_COMMUNITY): Payer: Self-pay

## 2019-12-06 NOTE — Progress Notes (Signed)
Error

## 2019-12-07 ENCOUNTER — Ambulatory Visit: Payer: BC Managed Care – PPO | Admitting: Cardiology

## 2019-12-18 ENCOUNTER — Telehealth: Payer: Self-pay

## 2020-02-15 ENCOUNTER — Ambulatory Visit (INDEPENDENT_AMBULATORY_CARE_PROVIDER_SITE_OTHER): Payer: BC Managed Care – PPO | Admitting: Psychiatry

## 2020-02-15 ENCOUNTER — Encounter: Payer: Self-pay | Admitting: Psychiatry

## 2020-02-15 ENCOUNTER — Other Ambulatory Visit: Payer: Self-pay

## 2020-02-15 DIAGNOSIS — F411 Generalized anxiety disorder: Secondary | ICD-10-CM | POA: Diagnosis not present

## 2020-02-15 DIAGNOSIS — F422 Mixed obsessional thoughts and acts: Secondary | ICD-10-CM

## 2020-02-15 MED ORDER — FLUOXETINE HCL 20 MG PO CAPS
40.0000 mg | ORAL_CAPSULE | Freq: Every day | ORAL | 1 refills | Status: DC
Start: 2020-02-15 — End: 2020-05-04

## 2020-02-15 MED ORDER — BUSPIRONE HCL 15 MG PO TABS: ORAL_TABLET | ORAL | 1 refills | Status: DC

## 2020-02-15 NOTE — Progress Notes (Signed)
Adam Herrera [id number] 05/11/1980 39 y.o.  Subjective:   Patient ID:  Adam Herrera is a 40 y.o. (DOB 02/06/1980) male.  Chief Complaint:  Chief Complaint  Patient presents with  . Anxiety  . Other    Irritability    HPI Adam Herrera presents to the office today for follow-up of anxiety and irritability. He reports that medication "did really well until about December" and both he and his wife noticed medication seemed to "stop working" despite taking medication consistently.  He and his wife have noticed he has been more irritable and anxious. He reports that these s/s have resulted in sad mood. He reports that he continues to be irritated by clutter. Denies panic attacks. Sleep has been ok. Awakens himself almost every night talking in his sleep in response to a dream. Appetite has been "fine." He reports that he has gained some weight and reports that he has not been eating as healthy. He reports that his energy is good. Motivation has been ok. Concentration has been ok. Denies SI.   Started a new position at work and works 5 am to 2 pm.   Past Psychiatric Medication Trials:  Seroquel- Reports vivid dreams and nightmares with perceptual disturbances. Helped stabilize mood.  Lexapro- Wt gain, sexual side effects. Helped with anxiety. Reports initially felt very calm, "like a zombie."  Paxil- Ineffective Prozac- Was effective and then stopped becoming effective after 9 months. Took 60 mg.  Sertraline- Effective. Initially caused some sleep disturbance. Reports that it did not seem to be as effective over time. Viibryd- Effective, cost prohibitive. He reports that Viibryd seemed to be less effective over time. Trazodone  PHQ2-9   Flowsheet Row Office Visit from 12/28/2014 in Primary Care at [facility] Total Score 0       Review of Systems:  Review of Systems  Musculoskeletal: Negative for gait problem.  Neurological: Negative for tremors.   Psychiatric/Behavioral:       Please refer to HPI    Medications: I have reviewed the patient's current medications.  Current Outpatient Medications  Medication Sig Dispense Refill  . atorvastatin (LIPITOR) 40 MG tablet Take 40 mg by mouth daily.    . busPIRone (BUSPAR) 15 MG tablet Take 1/3 tablet p.o. twice daily for 1 week, then take 2/3 tablet p.o. twice daily for 1 week, then take 1 tablet p.o. twice daily 60 tablet 1  . ergocalciferol (VITAMIN D2) 1.25 MG (50000 UT) capsule Vitamin D2 1,250 mcg (50,000 unit) capsule  Take 1 capsule every week by oral route.    . valACYclovir (VALTREX) 1000 MG tablet Take 1,000 mg by mouth daily as needed.    Marland Kitchen FLUoxetine (PROZAC) 20 MG capsule Take 2 capsules (40 mg total) by mouth daily. Take 2-3 capsules po qd 270 capsule 1  . metoprolol tartrate (LOPRESSOR) 25 MG tablet Take 1 tablet (25 mg total) by mouth 2 (two) times daily. 120 tablet 3  . nitroGLYCERIN (NITROSTAT) 0.4 MG SL tablet Place 1 tablet (0.4 mg total) under the tongue every 5 (five) minutes as needed for chest pain. 30 tablet 3   No current facility-administered medications for this visit.    Medication Side Effects: Other: Twitches on occasion in arms and legs  Allergies:  Allergies  Allergen Reactions  . Doxycycline     Severe headache  . Penicillins Rash    Past Medical History:  Diagnosis Date  . Anxiety   . Elevated cholesterol   . Vitamin  D deficiency     Family History  Problem Relation Age of Onset  . Anxiety disorder Mother   . Depression Mother   . Alcohol abuse Paternal Grandfather   . Heart attack Paternal Grandfather   . Anxiety disorder Maternal Aunt   . Anxiety disorder Maternal Uncle     Social History   Socioeconomic History  . Marital status: Married    Spouse name: Not on file  . Number of children: Not on file  . Years of education: Not on file  . Highest education level: Not on file  Occupational History  . Not on file  Tobacco Use   . Smoking status: Never Smoker  . Smokeless tobacco: Never Used  Vaping Use  . Vaping Use: Never used  Substance and Sexual Activity  . Alcohol use: Yes    Comment: Occasional social ETOH use on the weekends  . Drug use: No  . Sexual activity: Not on file  Other Topics Concern  . Not on file  Social History Narrative  . Not on file   Social Determinants of Health   Financial Resource Strain: Not on file  Food Insecurity: Not on file  Transportation Needs: Not on file  Physical Activity: Not on file  Stress: Not on file  Social Connections: Not on file  Intimate Partner Violence: Not on file    Past Medical History, Surgical history, Social history, and Family history were reviewed and updated as appropriate.   Please see review of systems for further details on the patient's review from today.   Objective:   Physical Exam:  There were no vitals taken for this visit.  Physical Exam Constitutional:      General: He is not in acute distress. Musculoskeletal:        General: No deformity.  Neurological:     Mental Status: He is alert and oriented to person, place, and time.     Coordination: Coordination normal.  Psychiatric:        Attention and Perception: Attention and perception normal. He does not perceive auditory or visual hallucinations.        Mood and Affect: Mood is anxious. Mood is not depressed. Affect is not labile, blunt, angry or inappropriate.        Speech: Speech normal.        Behavior: Behavior normal.        Thought Content: Thought content normal. Thought content is not paranoid or delusional. Thought content does not include homicidal or suicidal ideation. Thought content does not include homicidal or suicidal plan.        Cognition and Memory: Cognition and memory normal.        Judgment: Judgment normal.     Comments: Insight intact     Lab Review:     Component Value Date/Time   NA 142 09/16/2019 1624   K 4.8 09/16/2019 1624   CL 104  09/16/2019 1624   CO2 27 09/16/2019 1624   GLUCOSE 91 09/16/2019 1624   GLUCOSE 92 05/05/2012 1528   BUN 16 09/16/2019 1624   CREATININE 0.88 09/16/2019 1624   CALCIUM 9.7 09/16/2019 1624   GFRNONAA 107 09/16/2019 1624   GFRAA 124 09/16/2019 1624       Component Value Date/Time   WBC 5.6 05/05/2012 1528   RBC 4.51 05/05/2012 1528   HGB 13.7 05/05/2012 1528   HCT 39.3 05/05/2012 1528   PLT 180 05/05/2012 1528   MCV 87.1 05/05/2012 1528  MCH 30.4 05/05/2012 1528   MCHC 34.9 05/05/2012 1528   RDW 12.8 05/05/2012 1528    No results found for: POCLITH, LITHIUM   No results found for: PHENYTOIN, PHENOBARB, VALPROATE, CBMZ   .res Assessment: Plan:   Discussed potential benefits, risks, and side effects of treatment options to include Buspar and Cymbalta.  Pt agrees to trial of Buspar. Start BuSpar 15 mg 1/3 tablet twice daily for 1 week, then increase to 2/3 tablet twice daily for 1 week, then increase to 1 tablet twice daily for anxiety. Will decrease Prozac to 40 mg po qd to rule out increased activation with Prozac 60 mg po qd.  Pt to follow-up in 6-8 weeks or sooner if clinically indicated.  Patient advised to contact office with any questions, adverse effects, or acute worsening in signs and symptoms.  Adam Herrera was seen today for anxiety and other.  Diagnoses and all orders for this visit:  Generalized anxiety disorder -     busPIRone (BUSPAR) 15 MG tablet; Take 1/3 tablet p.o. twice daily for 1 week, then take 2/3 tablet p.o. twice daily for 1 week, then take 1 tablet p.o. twice daily -     FLUoxetine (PROZAC) 20 MG capsule; Take 2 capsules (40 mg total) by mouth daily. Take 2-3 capsules po qd  Mixed obsessional thoughts and acts -     FLUoxetine (PROZAC) 20 MG capsule; Take 2 capsules (40 mg total) by mouth daily. Take 2-3 capsules po qd     Please see After Visit Summary for patient specific instructions.  Future Appointments  Date Time Provider Department  Center  04/08/2020 10:00 AM Corie Chiquito, PMHNP [facility] None    No orders of the defined types were placed in this encounter.   -------------------------------

## 2020-04-08 ENCOUNTER — Ambulatory Visit: Payer: BC Managed Care – PPO | Admitting: Psychiatry

## 2020-05-01 ENCOUNTER — Other Ambulatory Visit: Payer: Self-pay | Admitting: Psychiatry

## 2020-05-01 DIAGNOSIS — F411 Generalized anxiety disorder: Secondary | ICD-10-CM

## 2020-05-01 DIAGNOSIS — F422 Mixed obsessional thoughts and acts: Secondary | ICD-10-CM

## 2020-05-04 NOTE — Telephone Encounter (Signed)
Pt called requesting refill on prozac & buspar @ [facility] . Apt 6/8

## 2020-06-01 ENCOUNTER — Encounter: Payer: Self-pay | Admitting: Psychiatry

## 2020-06-15 ENCOUNTER — Ambulatory Visit (INDEPENDENT_AMBULATORY_CARE_PROVIDER_SITE_OTHER): Payer: BC Managed Care – PPO | Admitting: Psychiatry

## 2020-06-15 ENCOUNTER — Encounter: Payer: Self-pay | Admitting: Psychiatry

## 2020-06-15 ENCOUNTER — Other Ambulatory Visit: Payer: Self-pay

## 2020-06-15 DIAGNOSIS — F39 Unspecified mood [affective] disorder: Secondary | ICD-10-CM | POA: Diagnosis not present

## 2020-06-15 DIAGNOSIS — F411 Generalized anxiety disorder: Secondary | ICD-10-CM | POA: Diagnosis not present

## 2020-06-15 DIAGNOSIS — F422 Mixed obsessional thoughts and acts: Secondary | ICD-10-CM | POA: Diagnosis not present

## 2020-06-15 MED ORDER — FLUOXETINE HCL 20 MG PO CAPS: ORAL_CAPSULE | ORAL | 1 refills | Status: DC

## 2020-06-15 MED ORDER — DIVALPROEX SODIUM ER 250 MG PO TB24
ORAL_TABLET | ORAL | 1 refills | Status: DC
Start: 2020-06-15 — End: 2020-08-02

## 2020-06-15 MED ORDER — BUSPIRONE HCL 15 MG PO TABS: ORAL_TABLET | ORAL | 1 refills | Status: DC

## 2020-06-15 NOTE — Progress Notes (Signed)
[name] [id number] Dec 13, 1980 39 y.o.  Subjective:   Patient ID:  Adam Herrera is a 40 y.o. (DOB [birth year]) male.  Chief Complaint:  Chief Complaint  Patient presents with   Anxiety   Depression     HPI Hero T Sites presents to the office today for follow-up of anxiety and depression. He is noticing increased irritability. "I'm just frustrated all the time" at work and at home. He reports that he feels frustrated in general without an identifiable trigger. He reports "anything... the smallest stuff" can trigger his frustration to include things taking too long, someone getting in his way, etc. He denies significant anxiety or depression other than in response to frustration. He reports that he will get verbally agitated with family and does not want to do this and will withdraw at times as a result.   Sleeping fine. Less frequent middle of the night awakenings. Appetite has been good. Energy and motivation have been good. Concentration has been ok. Denies SI.   His step-daughter had a promotion ceremony today.   Still working 5 am to 2 pm. He reports that this schedule works well for him.   Past Psychiatric Medication Trials:  Seroquel- Reports vivid dreams and nightmares with perceptual disturbances. Helped stabilize mood.  Lexapro- Wt gain, sexual side effects. Helped with anxiety. Reports initially felt very calm, "like a zombie."  Paxil- Ineffective  Prozac- Was effective and then stopped becoming effective after 9 months. Took 60 mg.  Sertraline- Effective. Initially caused some sleep disturbance. Reports that it did not seem to be as effective over time.  Viibryd- Effective, cost prohibitive. He reports that Viibryd seemed to be less effective over time. Buspar- ineffective Trazodone    PHQ2-9    Flowsheet Row Office Visit from 12/28/2014 in Primary Care at [facility] Total Score 0        Review of Systems:  Review of Systems   Cardiovascular:  Negative for chest pain.  Musculoskeletal:  Negative for gait problem.  Neurological:  Negative for tremors.  Psychiatric/Behavioral:         Please refer to HPI   Medications: I have reviewed the patient's current medications.  Current Outpatient Medications  Medication Sig Dispense Refill   atorvastatin (LIPITOR) 40 MG tablet Take 40 mg by mouth daily.     divalproex (DEPAKOTE ER) 250 MG 24 hr tablet Take 1 tablet at bedtime for 3 nights, then increase to 2 tablets at bedtime for 1 week. May increase to 3 tablets as tolerated. 90 tablet 1   ergocalciferol (VITAMIN D2) 1.25 MG (50000 UT) capsule Vitamin D2 1,250 mcg (50,000 unit) capsule  Take 1 capsule every week by oral route.     valACYclovir (VALTREX) 1000 MG tablet Take 1,000 mg by mouth daily as needed.     busPIRone (BUSPAR) 15 MG tablet Take 1/2 tablet twice daily for one week, then stop 60 tablet 1   FLUoxetine (PROZAC) 20 MG capsule Decrease to 1 capsule po qd for one week, then stop 60 capsule 1   metoprolol tartrate (LOPRESSOR) 25 MG tablet Take 1 tablet (25 mg total) by mouth 2 (two) times daily. 120 tablet 3   nitroGLYCERIN (NITROSTAT) 0.4 MG SL tablet Place 1 tablet (0.4 mg total) under the tongue every 5 (five) minutes as needed for chest pain. 30 tablet 3   No current facility-administered medications for this visit.    Medication Side Effects: None  Allergies:  Allergies  Allergen Reactions  Doxycycline     Severe headache   Penicillins Rash    Past Medical History:  Diagnosis Date   Anxiety    Elevated cholesterol    Vitamin D deficiency     Past Medical History, Surgical history, Social history, and Family history were reviewed and updated as appropriate.   Please see review of systems for further details on the patient's review from today.   Objective:   Physical Exam:  There were no vitals taken for this visit.  Physical Exam Constitutional:      General: He is not in acute  distress. Musculoskeletal:        General: No deformity.  Neurological:     Mental Status: He is alert and oriented to person, place, and time.     Coordination: Coordination normal.  Psychiatric:        Attention and Perception: Attention and perception normal. He does not perceive auditory or visual hallucinations.        Mood and Affect: Mood is anxious. Affect is not labile, blunt, angry or inappropriate.        Speech: Speech normal.        Behavior: Behavior normal.        Thought Content: Thought content normal. Thought content is not paranoid or delusional. Thought content does not include homicidal or suicidal ideation. Thought content does not include homicidal or suicidal plan.        Cognition and Memory: Cognition and memory normal.        Judgment: Judgment normal.     Comments: Insight intact Dysphoric mood    Lab Review:     Component Value Date/Time   NA 142 09/16/2019 1624   K 4.8 09/16/2019 1624   CL 104 09/16/2019 1624   CO2 27 09/16/2019 1624   GLUCOSE 91 09/16/2019 1624   GLUCOSE 92 05/05/2012 1528   BUN 16 09/16/2019 1624   CREATININE 0.88 09/16/2019 1624   CALCIUM 9.7 09/16/2019 1624   GFRNONAA 107 09/16/2019 1624   GFRAA 124 09/16/2019 1624       Component Value Date/Time   WBC 5.6 05/05/2012 1528   RBC 4.51 05/05/2012 1528   HGB 13.7 05/05/2012 1528   HCT 39.3 05/05/2012 1528   PLT 180 05/05/2012 1528   MCV 87.1 05/05/2012 1528   MCH 30.4 05/05/2012 1528   MCHC 34.9 05/05/2012 1528   RDW 12.8 05/05/2012 1528    No results found for: POCLITH, LITHIUM   No results found for: PHENYTOIN, PHENOBARB, VALPROATE, CBMZ   .res Assessment: Plan:     Patient seen for 30 minutes and time spent counseling the patient regarding possible treatment options to include Depakote or Cymbalta.  Patient reports that he would prefer to start with trial of Depakote.  Discussed potential benefits, risks, and side effects with Depakote and discussed the need for  periodic lab monitoring, to include monitoring blood count and liver enzymes. Will start Depakote ER 250 mg at bedtime for 3 nights, then increase to 500 mg at bedtime for at least 1 week.  Discussed that he may increase up to 750 mg at bedtime if needed for irritability.  Will decrease Prozac to 20 mg daily for 1 week, and then stop due to limited benefit and possible increased activation. Will decrease BuSpar to 7-1/2 mg twice daily for 1 week and then stop due to limited improvement. Patient to follow-up in 6 weeks or sooner if clinically indicated. Patient advised to contact office with any  questions, adverse effects, or acute worsening in signs and symptoms.  Reo was seen today for anxiety and depression.  Diagnoses and all orders for this visit:  Episodic mood disorder (HCC) -     divalproex (DEPAKOTE ER) 250 MG 24 hr tablet; Take 1 tablet at bedtime for 3 nights, then increase to 2 tablets at bedtime for 1 week. May increase to 3 tablets as tolerated.  Generalized anxiety disorder -     FLUoxetine (PROZAC) 20 MG capsule; Decrease to 1 capsule po qd for one week, then stop -     busPIRone (BUSPAR) 15 MG tablet; Take 1/2 tablet twice daily for one week, then stop  Mixed obsessional thoughts and acts -     FLUoxetine (PROZAC) 20 MG capsule; Decrease to 1 capsule po qd for one week, then stop    Please see After Visit Summary for patient specific instructions.  Future Appointments  Date Time Provider Department Center  08/02/2020  2:30 PM Corie Chiquito, PMHNP [facility] None    No orders of the defined types were placed in this encounter.   -------------------------------

## 2020-08-02 ENCOUNTER — Other Ambulatory Visit: Payer: Self-pay

## 2020-08-02 ENCOUNTER — Ambulatory Visit (INDEPENDENT_AMBULATORY_CARE_PROVIDER_SITE_OTHER): Payer: BC Managed Care – PPO | Admitting: Psychiatry

## 2020-08-02 ENCOUNTER — Encounter: Payer: Self-pay | Admitting: Psychiatry

## 2020-08-02 DIAGNOSIS — F39 Unspecified mood [affective] disorder: Secondary | ICD-10-CM

## 2020-08-02 MED ORDER — DIVALPROEX SODIUM ER 500 MG PO TB24: 1000.0000 mg | ORAL_TABLET | Freq: Every day | ORAL | 2 refills | Status: DC

## 2020-08-02 NOTE — Progress Notes (Signed)
Adam Herrera [id number] 1980/12/11 40 y.o.  Subjective:   Patient ID:  Adam Herrera is a 40 y.o. (DOB 10/23/1980) male.  Chief Complaint:  Chief Complaint  Patient presents with   Follow-up    Anger, anxiety    HPI [name] presents to the office today for follow-up of anxiety and agitation. He reports that Depakote may "work a little bit better." He reports that he feels more relaxed after he takes it in the evening. He reports that his wife has noticed that "the outbursts are better." He reports that he continues to notice some frustration and irritation at baseline. He reports that he feels his stomach has been distended. He has noticed some weight gain and reports that he went on vacation and also cookouts 5 days in a row around July 4th and is unsure if it is due to medication. He reports that he enjoys eating and eats quickly. He reports that his anxiety has been manageable. Denies depressed mood. Sleeping well. Energy and motivation have been good. Denies concentration impairment. Denies SI.   Reports that his 34 yo daughter is "just like me" and is indifferent to consequences. Older daughter is 3 yo.   Past Psychiatric Medication Trials: Seroquel- Reports vivid dreams and nightmares with perceptual disturbances. Helped stabilize mood. Lexapro- Wt gain, sexual side effects. Helped with anxiety. Reports initially felt very calm, "like a zombie." Paxil- Ineffective  Prozac- Was effective and then stopped becoming effective after 9 months. Took 60 mg.  Sertraline- Effective. Initially caused some sleep disturbance. Reports that it did not seem to be as effective over time.  Viibryd- Effective, cost prohibitive. He reports that Viibryd seemed to be less effective over time. Buspar- ineffective Trazodone  PHQ2-9    Flowsheet Row Office Visit from 12/28/2014 in Primary Care at [facility] Total Score 0        Review of Systems:  Review of Systems   Gastrointestinal:  Positive for abdominal distention.  Musculoskeletal:  Negative for gait problem.  Neurological:  Negative for tremors.  Psychiatric/Behavioral:         Please refer to HPI   Medications: I have reviewed the patient's current medications.  Current Outpatient Medications  Medication Sig Dispense Refill   atorvastatin (LIPITOR) 40 MG tablet Take 40 mg by mouth daily.     ergocalciferol (VITAMIN D2) 1.25 MG (50000 UT) capsule Vitamin D2 1,250 mcg (50,000 unit) capsule  Take 1 capsule every week by oral route.     valACYclovir (VALTREX) 1000 MG tablet Take 1,000 mg by mouth daily as needed.     divalproex (DEPAKOTE ER) 500 MG 24 hr tablet Take 2 tablets (1,000 mg total) by mouth at bedtime. 60 tablet 2   metoprolol tartrate (LOPRESSOR) 25 MG tablet Take 1 tablet (25 mg total) by mouth 2 (two) times daily. 120 tablet 3   nitroGLYCERIN (NITROSTAT) 0.4 MG SL tablet Place 1 tablet (0.4 mg total) under the tongue every 5 (five) minutes as needed for chest pain. 30 tablet 3   No current facility-administered medications for this visit.    Medication Side Effects: Other: Heartburn  Allergies:  Allergies  Allergen Reactions   Doxycycline     Severe headache   Penicillins Rash    Past Medical History:  Diagnosis Date   Anxiety    Elevated cholesterol    Vitamin D deficiency     Past Medical History, Surgical history, Social history, and Family history were reviewed and updated  as appropriate.   Please see review of systems for further details on the patient's review from today.   Objective:   Physical Exam:  Wt 200 lb (90.7 kg)   BMI 30.41 kg/m   Physical Exam Constitutional:      General: He is not in acute distress. Musculoskeletal:        General: No deformity.  Neurological:     Mental Status: He is alert and oriented to person, place, and time.     Coordination: Coordination normal.  Psychiatric:        Attention and Perception: Attention and  perception normal. He does not perceive auditory or visual hallucinations.        Mood and Affect: Mood normal. Mood is not anxious or depressed. Affect is not labile, blunt, angry or inappropriate.        Speech: Speech normal.        Behavior: Behavior normal.        Thought Content: Thought content normal. Thought content is not paranoid or delusional. Thought content does not include homicidal or suicidal ideation. Thought content does not include homicidal or suicidal plan.        Cognition and Memory: Cognition and memory normal.        Judgment: Judgment normal.     Comments: Insight intact    Lab Review:     Component Value Date/Time   NA 142 09/16/2019 1624   K 4.8 09/16/2019 1624   CL 104 09/16/2019 1624   CO2 27 09/16/2019 1624   GLUCOSE 91 09/16/2019 1624   GLUCOSE 92 05/05/2012 1528   BUN 16 09/16/2019 1624   CREATININE 0.88 09/16/2019 1624   CALCIUM 9.7 09/16/2019 1624   GFRNONAA 107 09/16/2019 1624   GFRAA 124 09/16/2019 1624       Component Value Date/Time   WBC 5.6 05/05/2012 1528   RBC 4.51 05/05/2012 1528   HGB 13.7 05/05/2012 1528   HCT 39.3 05/05/2012 1528   PLT 180 05/05/2012 1528   MCV 87.1 05/05/2012 1528   MCH 30.4 05/05/2012 1528   MCHC 34.9 05/05/2012 1528   RDW 12.8 05/05/2012 1528    No results found for: POCLITH, LITHIUM   No results found for: PHENYTOIN, PHENOBARB, VALPROATE, CBMZ   .res Assessment: Plan:    Pt seen for 30 minutes and time spent counseling pt regarding treatment options. Discussed potential benefits, risks, and side effects of increasing dose of Depakote ER since he reports an overall significant improvement in mood s/s with Depakote 750 mg po QHS with some residual irritation. Pt agrees to increase in Depakote ER to 100 mg po QHS for mood s/s. Pt to follow-up in 2-3 months or sooner if clinically indicated.  Patient advised to contact office with any questions, adverse effects, or acute worsening in signs and  symptoms.   [name] was seen today for follow-up.  Diagnoses and all orders for this visit:  Episodic mood disorder (HCC) -     divalproex (DEPAKOTE ER) 500 MG 24 hr tablet; Take 2 tablets (1,000 mg total) by mouth at bedtime.    Please see After Visit Summary for patient specific instructions.  Future Appointments  Date Time Provider Department Center  10/11/2020  2:00 PM Corie Chiquito, PMHNP [facility] None    No orders of the defined types were placed in this encounter.   -------------------------------

## 2020-09-22 ENCOUNTER — Ambulatory Visit (INDEPENDENT_AMBULATORY_CARE_PROVIDER_SITE_OTHER): Payer: BC Managed Care – PPO | Admitting: Psychiatry

## 2020-09-22 ENCOUNTER — Encounter: Payer: Self-pay | Admitting: Psychiatry

## 2020-09-22 ENCOUNTER — Other Ambulatory Visit: Payer: Self-pay

## 2020-09-22 DIAGNOSIS — F39 Unspecified mood [affective] disorder: Secondary | ICD-10-CM | POA: Diagnosis not present

## 2020-09-22 DIAGNOSIS — F411 Generalized anxiety disorder: Secondary | ICD-10-CM | POA: Diagnosis not present

## 2020-09-22 DIAGNOSIS — F422 Mixed obsessional thoughts and acts: Secondary | ICD-10-CM

## 2020-09-22 MED ORDER — FLUVOXAMINE MALEATE 100 MG PO TABS: ORAL_TABLET | ORAL | 1 refills | Status: DC

## 2020-09-22 MED ORDER — DIVALPROEX SODIUM ER 500 MG PO TB24: ORAL_TABLET | ORAL | 2 refills | Status: DC

## 2020-09-22 NOTE — Progress Notes (Signed)
Adam Herrera [id number] 06/29/1980 40 y.o.  Subjective:   Patient ID:  Adam Herrera is a 40 y.o. (DOB June 11, 1980) male.  Chief Complaint:  Chief Complaint  Patient presents with   Anxiety   Other    Irritability    HPI Wassim T Koci presents to the office today for follow-up of anxiety and irritability.  He reports that his OCD s/s have increased at home and children are noticing this. He reports that he has been irritable. He reports that he noticed increased sexual side effects with increase in Depakote. He has also noticed weight gain and loose stools.   He reports obsessive thoughts about cleanliness and perfectionistic thinking. He reports that he is frequently straightening up behind family members and this causes some agitation. He is also repeatedly asking them to pick up after themselves, ie multiple requests in a short-time. Denies checking doors or excessive handwashing. He reports perfectionistic thinking at work. He notices some worry. He reports irritability at times when "there's no reason." He reports some recurrence of depression- "I don't really have a reason to be depressed... I have a good family, a job." Sleeping well. Appetite has been increased and has been snacking more. Energy and motivation have been fine. Concentration is adequate. Denies SI.   Denies any acute stressors or psychosocial changes.   Past Psychiatric Medication Trials: Seroquel- Reports vivid dreams and nightmares with perceptual disturbances. Helped stabilize mood. Lexapro- Wt gain, sexual side effects. Helped with anxiety. Reports initially felt very calm, "like a zombie." Paxil- Ineffective  Prozac- Was effective and then stopped becoming effective after 9 months. Took 60 mg.  Sertraline- Effective. Initially caused some sleep disturbance. Reports that it did not seem to be as effective over time.  Viibryd- Effective, cost prohibitive. He reports that Viibryd seemed to be  less effective over time. Buspar- ineffective Trazodone  PHQ2-9    Flowsheet Row Office Visit from 12/28/2014 in Primary Care at [facility] Total Score 0        Review of Systems:  Review of Systems  Gastrointestinal:  Positive for diarrhea.  Musculoskeletal:  Negative for gait problem.  Neurological:  Negative for tremors.  Psychiatric/Behavioral:         Please refer to HPI   Medications: I have reviewed the patient's current medications.  Current Outpatient Medications  Medication Sig Dispense Refill   atorvastatin (LIPITOR) 40 MG tablet Take 40 mg by mouth daily.     ergocalciferol (VITAMIN D2) 1.25 MG (50000 UT) capsule Vitamin D2 1,250 mcg (50,000 unit) capsule  Take 1 capsule every week by oral route.     fluvoxaMINE (LUVOX) 100 MG tablet Take 1/2 tablet at bedtime for one week, then increase to 1 tablet at bedtime for one week, then increase to 1/2 tablet in the morning and 1 tablet at bedtime 45 tablet 1   valACYclovir (VALTREX) 1000 MG tablet Take 1,000 mg by mouth daily as needed.     divalproex (DEPAKOTE ER) 500 MG 24 hr tablet Take 1 tablet at bedtime for one week, then stop 60 tablet 2   metoprolol tartrate (LOPRESSOR) 25 MG tablet Take 1 tablet (25 mg total) by mouth 2 (two) times daily. 120 tablet 3   nitroGLYCERIN (NITROSTAT) 0.4 MG SL tablet Place 1 tablet (0.4 mg total) under the tongue every 5 (five) minutes as needed for chest pain. 30 tablet 3   No current facility-administered medications for this visit.    Medication Side Effects: Other:  GI side effects, weight gain, sexual side effects  Allergies:  Allergies  Allergen Reactions   Doxycycline     Severe headache   Penicillins Rash    Past Medical History:  Diagnosis Date   Anxiety    Elevated cholesterol    Vitamin D deficiency     Past Medical History, Surgical history, Social history, and Family history were reviewed and updated as appropriate.   Please see review of systems for  further details on the patient's review from today.   Objective:   Physical Exam:  There were no vitals taken for this visit.  Physical Exam Constitutional:      General: He is not in acute distress. Musculoskeletal:        General: No deformity.  Neurological:     Mental Status: He is alert and oriented to person, place, and time.     Coordination: Coordination normal.  Psychiatric:        Attention and Perception: Attention and perception normal. He does not perceive auditory or visual hallucinations.        Mood and Affect: Mood is anxious. Affect is not labile, blunt, angry or inappropriate.        Speech: Speech normal.        Behavior: Behavior normal.        Thought Content: Thought content normal. Thought content is not paranoid or delusional. Thought content does not include homicidal or suicidal ideation. Thought content does not include homicidal or suicidal plan.        Cognition and Memory: Cognition and memory normal.        Judgment: Judgment normal.     Comments: Insight intact Dysphoric mood    Lab Review:     Component Value Date/Time   NA 142 09/16/2019 1624   K 4.8 09/16/2019 1624   CL 104 09/16/2019 1624   CO2 27 09/16/2019 1624   GLUCOSE 91 09/16/2019 1624   GLUCOSE 92 05/05/2012 1528   BUN 16 09/16/2019 1624   CREATININE 0.88 09/16/2019 1624   CALCIUM 9.7 09/16/2019 1624   GFRNONAA 107 09/16/2019 1624   GFRAA 124 09/16/2019 1624       Component Value Date/Time   WBC 5.6 05/05/2012 1528   RBC 4.51 05/05/2012 1528   HGB 13.7 05/05/2012 1528   HCT 39.3 05/05/2012 1528   PLT 180 05/05/2012 1528   MCV 87.1 05/05/2012 1528   MCH 30.4 05/05/2012 1528   MCHC 34.9 05/05/2012 1528   RDW 12.8 05/05/2012 1528    No results found for: POCLITH, LITHIUM   No results found for: PHENYTOIN, PHENOBARB, VALPROATE, CBMZ   .res Assessment: Plan:   Pt seen for 30 minutes and time spent counseling pt regarding treatment options. Agreed that irritation  seems to be symptom/manifestation of anxiety. Discussed potential benefits, risks, and side effects of Luvox. Pt agrees to trial of Luvox. Will start Luvox 50 mg po QHS for one week, then 100 mg po QHS for one week, then 150 mg po QHS for anxiety.  Will decrease Depakote Er to 500 mg po QHS for one week, then stop due to limited improvement and multiple side effects (wt gain, sexual side effects, GI side effects). Pt to follow-up in 6-8 weeks or sooner if clinically indicated.  Patient advised to contact office with any questions, adverse effects, or acute worsening in signs and symptoms.  Flem was seen today for anxiety and other.  Diagnoses and all orders for this visit:  Generalized  anxiety disorder -     fluvoxaMINE (LUVOX) 100 MG tablet; Take 1/2 tablet at bedtime for one week, then increase to 1 tablet at bedtime for one week, then increase to 1/2 tablet in the morning and 1 tablet at bedtime  Episodic mood disorder (HCC) -     divalproex (DEPAKOTE ER) 500 MG 24 hr tablet; Take 1 tablet at bedtime for one week, then stop  Mixed obsessional thoughts and acts -     fluvoxaMINE (LUVOX) 100 MG tablet; Take 1/2 tablet at bedtime for one week, then increase to 1 tablet at bedtime for one week, then increase to 1/2 tablet in the morning and 1 tablet at bedtime    Please see After Visit Summary for patient specific instructions.  No future appointments.   No orders of the defined types were placed in this encounter.   -------------------------------

## 2020-10-11 ENCOUNTER — Ambulatory Visit: Payer: BC Managed Care – PPO | Admitting: Psychiatry

## 2020-11-04 ENCOUNTER — Ambulatory Visit: Payer: BC Managed Care – PPO | Admitting: Psychiatry

## 2020-12-09 ENCOUNTER — Ambulatory Visit (INDEPENDENT_AMBULATORY_CARE_PROVIDER_SITE_OTHER): Payer: BC Managed Care – PPO | Admitting: Psychiatry

## 2020-12-09 ENCOUNTER — Other Ambulatory Visit: Payer: Self-pay

## 2020-12-09 ENCOUNTER — Encounter: Payer: Self-pay | Admitting: Psychiatry

## 2020-12-09 DIAGNOSIS — F422 Mixed obsessional thoughts and acts: Secondary | ICD-10-CM | POA: Diagnosis not present

## 2020-12-09 DIAGNOSIS — F411 Generalized anxiety disorder: Secondary | ICD-10-CM | POA: Diagnosis not present

## 2020-12-09 MED ORDER — FLUVOXAMINE MALEATE 100 MG PO TABS: ORAL_TABLET | ORAL | 1 refills | Status: DC

## 2020-12-09 NOTE — Progress Notes (Signed)
Adam Herrera [id number] 11/13/1980 40 y.o.  Subjective:   Patient ID:  Adam Herrera is a 40 y.o. (DOB 08/18/1980) male.  Chief Complaint:  Chief Complaint  Patient presents with   Anxiety    Anxiety Patient reports no chest pain or palpitations.    Adam Herrera presents to the office today for follow-up of anxiety. He reports he initially had tremors and sleepiness with Luvox and then this resolved. He reports that he has had some sexual side effects. He reports that anxiety has been lower. He reports that he continues to have some OCD s/s and that they no longer interfere with his life. No longer asking family repetitively to do things. Reports that he is less rigid about cleanliness. He reports that he is less perfectionistic about work. Denies depressed mood. He report occasional irritability and that this has been better. Sleeping ok. Appetite has been good. Reports that he has lost 10 bs since stopping Depakote. Energy and motivation have been ok. Concentration has been ok. Denies SI.  Denies any acute stressors.  Plans to join a gym.   Past Psychiatric Medication Trials: Seroquel- Reports vivid dreams and nightmares with perceptual disturbances. Helped stabilize mood. Lexapro- Wt gain, sexual side effects. Helped with anxiety. Reports initially felt very calm, "like a zombie." Paxil- Ineffective  Prozac- Was effective and then stopped becoming effective after 9 months. Took 60 mg.  Sertraline- Effective. Initially caused some sleep disturbance. Reports that it did not seem to be as effective over time.  Viibryd- Effective, cost prohibitive. He reports that Viibryd seemed to be less effective over time. Buspar- ineffective Trazodone  PHQ2-9    Flowsheet Row Office Visit from 12/28/2014 in Primary Care at [facility] Total Score 0        Review of Systems:  Review of Systems  Cardiovascular:  Negative for chest pain and palpitations.   Gastrointestinal: Negative.   Musculoskeletal:  Negative for gait problem.  Neurological:  Negative for tremors.  Psychiatric/Behavioral:         Please refer to HPI   Medications: I have reviewed the patient's current medications.  Current Outpatient Medications  Medication Sig Dispense Refill   atorvastatin (LIPITOR) 40 MG tablet Take 40 mg by mouth daily.     ergocalciferol (VITAMIN D2) 1.25 MG (50000 UT) capsule Vitamin D2 1,250 mcg (50,000 unit) capsule  Take 1 capsule every week by oral route.     valACYclovir (VALTREX) 1000 MG tablet Take 1,000 mg by mouth daily as needed.     fluvoxaMINE (LUVOX) 100 MG tablet Take 0.5 tablets (50 mg total) by mouth every morning AND 1 tablet (100 mg total) at bedtime. 135 tablet 1   nitroGLYCERIN (NITROSTAT) 0.4 MG SL tablet Place 1 tablet (0.4 mg total) under the tongue every 5 (five) minutes as needed for chest pain. 30 tablet 3   No current facility-administered medications for this visit.    Medication Side Effects: Other: Sexual side effects  Allergies:  Allergies  Allergen Reactions   Doxycycline     Severe headache   Penicillins Rash    Past Medical History:  Diagnosis Date   Anxiety    Elevated cholesterol    Vitamin D deficiency     Past Medical History, Surgical history, Social history, and Family history were reviewed and updated as appropriate.   Please see review of systems for further details on the patient's review from today.   Objective:   Physical Exam:  Wt  190 lb (86.2 kg)   BMI 28.89 kg/m   Physical Exam Constitutional:      General: He is not in acute distress. Musculoskeletal:        General: No deformity.  Neurological:     Mental Status: He is alert and oriented to person, place, and time.     Coordination: Coordination normal.  Psychiatric:        Attention and Perception: Attention and perception normal. He does not perceive auditory or visual hallucinations.        Mood and Affect: Mood  normal. Mood is not anxious or depressed. Affect is not labile, blunt, angry or inappropriate.        Speech: Speech normal.        Behavior: Behavior normal.        Thought Content: Thought content normal. Thought content is not paranoid or delusional. Thought content does not include homicidal or suicidal ideation. Thought content does not include homicidal or suicidal plan.        Cognition and Memory: Cognition and memory normal.        Judgment: Judgment normal.     Comments: Insight intact    Lab Review:     Component Value Date/Time   NA 142 09/16/2019 1624   K 4.8 09/16/2019 1624   CL 104 09/16/2019 1624   CO2 27 09/16/2019 1624   GLUCOSE 91 09/16/2019 1624   GLUCOSE 92 05/05/2012 1528   BUN 16 09/16/2019 1624   CREATININE 0.88 09/16/2019 1624   CALCIUM 9.7 09/16/2019 1624   GFRNONAA 107 09/16/2019 1624   GFRAA 124 09/16/2019 1624       Component Value Date/Time   WBC 5.6 05/05/2012 1528   RBC 4.51 05/05/2012 1528   HGB 13.7 05/05/2012 1528   HCT 39.3 05/05/2012 1528   PLT 180 05/05/2012 1528   MCV 87.1 05/05/2012 1528   MCH 30.4 05/05/2012 1528   MCHC 34.9 05/05/2012 1528   RDW 12.8 05/05/2012 1528    No results found for: POCLITH, LITHIUM   No results found for: PHENYTOIN, PHENOBARB, VALPROATE, CBMZ   .res Assessment: Plan:   Will continue Luvox 50 mg po q am and 100 mg at bedtime for anxiety.  Pt to follow-up in 6 months or sooner if clinically indicated.  Patient advised to contact office with any questions, adverse effects, or acute worsening in signs and symptoms.  Adam Herrera was seen today for anxiety.  Diagnoses and all orders for this visit:  Generalized anxiety disorder -     fluvoxaMINE (LUVOX) 100 MG tablet; Take 0.5 tablets (50 mg total) by mouth every morning AND 1 tablet (100 mg total) at bedtime.  Mixed obsessional thoughts and acts -     fluvoxaMINE (LUVOX) 100 MG tablet; Take 0.5 tablets (50 mg total) by mouth every morning AND 1 tablet  (100 mg total) at bedtime.    Please see After Visit Summary for patient specific instructions.  Future Appointments  Date Time Provider Department Center  06/08/2021  4:00 PM Corie Chiquito, PMHNP [facility] None    No orders of the defined types were placed in this encounter.   -------------------------------

## 2021-02-28 ENCOUNTER — Encounter: Payer: Self-pay | Admitting: Psychiatry

## 2021-02-28 ENCOUNTER — Telehealth (INDEPENDENT_AMBULATORY_CARE_PROVIDER_SITE_OTHER): Payer: BC Managed Care – PPO | Admitting: Psychiatry

## 2021-02-28 DIAGNOSIS — F422 Mixed obsessional thoughts and acts: Secondary | ICD-10-CM | POA: Diagnosis not present

## 2021-02-28 DIAGNOSIS — F411 Generalized anxiety disorder: Secondary | ICD-10-CM | POA: Diagnosis not present

## 2021-02-28 MED ORDER — LAMOTRIGINE 100 MG PO TABS: ORAL_TABLET | ORAL | 0 refills | Status: DC

## 2021-02-28 MED ORDER — FLUVOXAMINE MALEATE 100 MG PO TABS: 100.0000 mg | ORAL_TABLET | Freq: Two times a day (BID) | ORAL | 0 refills | Status: DC

## 2021-02-28 MED ORDER — LAMOTRIGINE 25 MG PO TABS: ORAL_TABLET | ORAL | 0 refills | Status: DC

## 2021-02-28 NOTE — Progress Notes (Signed)
Adam Herrera [id number] [birth year] 41 y.o.  Virtual Visit via Video Note  I connected with pt @ on 02/28/21 at 10:30 AM EST by a video enabled telemedicine application and verified that I am speaking with the correct person using two identifiers.   I discussed the limitations of evaluation and management by telemedicine and the availability of in person appointments. The patient expressed understanding and agreed to proceed.  I discussed the assessment and treatment plan with the patient. The patient was provided an opportunity to ask questions and all were answered. The patient agreed with the plan and demonstrated an understanding of the instructions.   The patient was advised to call back or seek an in-person evaluation if the symptoms worsen or if the condition fails to improve as anticipated.  I provided 30 minutes of non-face-to-face time during this encounter.  The patient was located in his personal vehicle outside of work.  The provider was located at [facility].   Adam Herrera, PMHNP   Subjective:   Patient ID:  Adam Herrera is a 41 y.o. (DOB 04-Feb-1980) male.  Chief Complaint:  Chief Complaint  Patient presents with   Anxiety   Other    Irritability    Anxiety    Adam Herrera presents for follow-up of anxiety and irritability. He reports that medication seems to "work for [employer] and then it doesn't."   He reports OCD s/s have been increased and "annoying everybody at home." He reports that he is bothered by things being out of place and is wanting to clean up after family before they are done eating, etc. He reports that he "is constantly picking up." He reports that he has "agitation about everything." He reports, "I just want to be relaxed and not every little thing bothering me." He reports that he is distressed by things that he recognizes are "little." He reports that he worries about how his anxiety and behavior affects his  family. He notices he perfectionistic tendencies and at times he is projecting this onto his family. 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 He reports frequently irritability. Denies sad mood.  He reports that he is praying daily to "be calm."   Sleeping ok. Appetite has been good. Energy and motivation have been good. Denies impaired concentration. Denies SI.   Mother-in-law has cancer and just completed chemotherapy. Mother-in-law has had multiple hospitalizations. His grandmother recently had multiple strokes.   Wife is changing jobs and this is causing her some stress.   Past Psychiatric Medication Trials: Seroquel- Reports vivid dreams and nightmares with perceptual disturbances. Helped stabilize mood. Lexapro- Wt gain, sexual side effects. Helped with anxiety. Reports initially felt very calm, "like a zombie." Paxil- Ineffective  Prozac- Was effective and then stopped becoming effective after 9 months. Took 60 mg.  Sertraline- Effective. Initially caused some sleep disturbance. Reports that it did not seem to be as effective over time.  Luvox- Sexual side effects Viibryd- Effective, cost prohibitive. He reports that Viibryd seemed to be less effective over time. Buspar- ineffective Trazodone Depakote- wt loss  Review of Systems:  Review of Systems  Gastrointestinal: Negative.   Musculoskeletal:  Negative for gait problem.  Neurological:  Negative for tremors.  Psychiatric/Behavioral:         Please refer to HPI   Medications: I have reviewed the patient's current medications.  Current Outpatient Medications  Medication Sig Dispense Refill   atorvastatin (LIPITOR)  40 MG tablet Take 40 mg by mouth daily.     ergocalciferol (VITAMIN D2) 1.25 MG (50000 UT) capsule Vitamin D2 1,250 mcg (50,000 unit) capsule  Take 1 capsule every week by oral route.     lamoTRIgine (LAMICTAL) 100 MG tablet Take 1 tab po qd x 2 weeks, then increase to 1.5 tabs  po qd 45 tablet 0   lamoTRIgine (LAMICTAL) 25 MG tablet Take 1 tablet (25 mg total) by mouth daily for 14 days, THEN 2 tablets (50 mg total) daily for 14 days. 45 tablet 0   valACYclovir (VALTREX) 1000 MG tablet Take 1,000 mg by mouth daily as needed.     fluvoxaMINE (LUVOX) 100 MG tablet Take 1 tablet (100 mg total) by mouth 2 (two) times daily. 180 tablet 0   nitroGLYCERIN (NITROSTAT) 0.4 MG SL tablet Place 1 tablet (0.4 mg total) under the tongue every 5 (five) minutes as needed for chest pain. 30 tablet 3   No current facility-administered medications for this visit.    Medication Side Effects: Other: Decreased Libido  Allergies:  Allergies  Allergen Reactions   Doxycycline     Severe headache   Penicillins Rash    Past Medical History:  Diagnosis Date   Anxiety    Elevated cholesterol    Vitamin D deficiency     Family History  Problem Relation Age of Onset   Anxiety disorder Mother    Depression Mother    Alcohol abuse Paternal Grandfather    Heart attack Paternal Grandfather    Anxiety disorder Maternal Aunt    Anxiety disorder Maternal Uncle     Social History   Socioeconomic History   Marital status: Married    Spouse name: Not on file   Number of children: Not on file   Years of education: Not on file   Highest education level: Not on file  Occupational History   Not on file  Tobacco Use   Smoking status: Never   Smokeless tobacco: Never  Vaping Use   Vaping Use: Never used  Substance and Sexual Activity   Alcohol use: Yes    Comment: Occasional social ETOH use on the weekends   Drug use: No   Sexual activity: Not on file  Other Topics Concern   Not on file  Social History Narrative   ** Merged History Encounter **       Social Determinants of Health   Financial Resource Strain: Not on file  Food Insecurity: Not on file  Transportation Needs: Not on file  Physical Activity: Not on file  Stress: Not on file  Social Connections: Not on file   Intimate Partner Violence: Not on file    Past Medical History, Surgical history, Social history, and Family history were reviewed and updated as appropriate.   Please see review of systems for further details on the patient's review from today.   Objective:   Physical Exam:  There were no vitals taken for this visit.  Physical Exam Constitutional:      General: He is not in acute distress. Musculoskeletal:        General: No deformity.  Neurological:     Mental Status: He is alert and oriented to person, place, and time.     Coordination: Coordination normal.  Psychiatric:        Attention and Perception: Attention and perception normal. He does not perceive auditory or visual hallucinations.        Mood and Affect: Mood is  anxious. Affect is not labile, blunt, angry or inappropriate.        Speech: Speech normal.        Behavior: Behavior normal.        Thought Content: Thought content normal. Thought content is not paranoid or delusional. Thought content does not include homicidal or suicidal ideation. Thought content does not include homicidal or suicidal plan.        Cognition and Memory: Cognition and memory normal.        Judgment: Judgment normal.     Comments: Insight intact Dysphoric mood    Lab Review:     Component Value Date/Time   NA 142 09/16/2019 1624   K 4.8 09/16/2019 1624   CL 104 09/16/2019 1624   CO2 27 09/16/2019 1624   GLUCOSE 91 09/16/2019 1624   GLUCOSE 92 05/05/2012 1528   BUN 16 09/16/2019 1624   CREATININE 0.88 09/16/2019 1624   CALCIUM 9.7 09/16/2019 1624   GFRNONAA 107 09/16/2019 1624   GFRAA 124 09/16/2019 1624       Component Value Date/Time   WBC 5.6 05/05/2012 1528   RBC 4.51 05/05/2012 1528   HGB 13.7 05/05/2012 1528   HCT 39.3 05/05/2012 1528   PLT 180 05/05/2012 1528   MCV 87.1 05/05/2012 1528   MCH 30.4 05/05/2012 1528   MCHC 34.9 05/05/2012 1528   RDW 12.8 05/05/2012 1528    No results found for: POCLITH, LITHIUM    No results found for: PHENYTOIN, PHENOBARB, VALPROATE, CBMZ   .res Assessment: Plan:    Pt seen for 30 minutes and time spent discussing pattern of improved s/s with a medication and then medication no longer being effective. Discussed that this response typically occurs most often with SSRI's compared to other types of medications, such as SNRI's, older antidepressants, etc. Discussed potential benefits, risks, and side effects of treatment options to include adding low dose Clomipramine to Luvox or Lamictal. Counseled patient regarding potential benefits, risks, and side effects of Lamictal to include potential risk of Stevens-Johnson syndrome. Advised patient to stop taking Lamictal and contact office immediately if rash develops and to seek urgent medical attention if rash is severe and/or spreading quickly. Discussed that Lamictal is approved for Bipolar Depression and that using it to treat depression without Bipolar D/O or for anxiety are off-label indications. Discussed that some studies indicate that Lamictal may be helpful for OCD. Pt agrees to trial of Lamictal. Will start Lamictal 25 mg daily for 2 weeks, then increase to 50 mg daily for 2 weeks, then 100 mg daily for 2 weeks, then 150 mg daily for mood symptoms.  Will increase Luvox to 100 mg po BID to improve acute anxiety with goal to start dose reduction if/when anxiety s/s  improve with Lamictal. Pt to follow-up in 8 weeks or sooner if clinically indicated.  Patient advised to contact office with any questions, adverse effects, or acute worsening in signs and symptoms.    Rui was seen today for anxiety and other.  Diagnoses and all orders for this visit:  Generalized anxiety disorder -     fluvoxaMINE (LUVOX) 100 MG tablet; Take 1 tablet (100 mg total) by mouth 2 (two) times daily. -     lamoTRIgine (LAMICTAL) 25 MG tablet; Take 1 tablet (25 mg total) by mouth daily for 14 days, THEN 2 tablets (50 mg total) daily for 14  days. -     lamoTRIgine (LAMICTAL) 100 MG tablet; Take 1 tab po qd x  2 weeks, then increase to 1.5 tabs po qd  Mixed obsessional thoughts and acts -     fluvoxaMINE (LUVOX) 100 MG tablet; Take 1 tablet (100 mg total) by mouth 2 (two) times daily. -     lamoTRIgine (LAMICTAL) 25 MG tablet; Take 1 tablet (25 mg total) by mouth daily for 14 days, THEN 2 tablets (50 mg total) daily for 14 days. -     lamoTRIgine (LAMICTAL) 100 MG tablet; Take 1 tab po qd x 2 weeks, then increase to 1.5 tabs po qd     Please see After Visit Summary for patient specific instructions.  Future Appointments  Date Time Provider Department Center  06/08/2021  4:00 PM Adam Herrera, PMHNP [facility] None    No orders of the defined types were placed in this encounter.     -------------------------------

## 2021-03-23 ENCOUNTER — Telehealth: Payer: Self-pay | Admitting: Psychiatry

## 2021-03-23 NOTE — Telephone Encounter (Signed)
Pt LVM 3/15 after 5pm with questions about Lamotrigine Rx for 100 mg. After looking. Advised Pt was @ Pharmacy sent 2/21. Pt understood. ?

## 2021-05-02 ENCOUNTER — Encounter: Payer: Self-pay | Admitting: Psychiatry

## 2021-05-02 ENCOUNTER — Telehealth (INDEPENDENT_AMBULATORY_CARE_PROVIDER_SITE_OTHER): Payer: BC Managed Care – PPO | Admitting: Psychiatry

## 2021-05-02 DIAGNOSIS — F411 Generalized anxiety disorder: Secondary | ICD-10-CM

## 2021-05-02 DIAGNOSIS — F422 Mixed obsessional thoughts and acts: Secondary | ICD-10-CM

## 2021-05-02 MED ORDER — LAMOTRIGINE 200 MG PO TABS: 200.0000 mg | ORAL_TABLET | Freq: Every day | ORAL | 1 refills | Status: DC

## 2021-05-02 MED ORDER — FLUVOXAMINE MALEATE 100 MG PO TABS: ORAL_TABLET | ORAL | 0 refills | Status: DC

## 2021-05-02 NOTE — Progress Notes (Signed)
Adam Herrera ?[id number] ?[birth year] ?41 y.o. ? ?Virtual Visit via Video Note ? ?I connected with pt @ on 05/02/21 at  1:30 PM EDT by a video enabled telemedicine application and verified that I am speaking with the correct person using two identifiers. ?  ?I discussed the limitations of evaluation and management by telemedicine and the availability of in person appointments. The patient expressed understanding and agreed to proceed. ? ?I discussed the assessment and treatment plan with the patient. The patient was provided an opportunity to ask questions and all were answered. The patient agreed with the plan and demonstrated an understanding of the instructions. ?  ?The patient was advised to call back or seek an in-person evaluation if the symptoms worsen or if the condition fails to improve as anticipated. ? ?I provided 21 minutes of non-face-to-face time during this encounter.  The patient was located in his personal vehicle outside of his work.  The provider was located at [place]. ? ? ?Thayer Headings, PMHNP  ? ?Subjective:  ? ?Patient ID:  Adam Herrera is a 41 y.o. (DOB 04-26-1980) male. ? ?Chief Complaint:  ?Chief Complaint  ?Patient presents with  ? Follow-up  ?  Anxiety  ? ? ?HPI ?Adam Herrera presents for follow-up of anxiety. He reports that the "OCD part is ok." He reports that if he is doing a project, such as deep cleaning a room, he will say what he cleaned and will ask his wife to clean up some clutter and this causes some tension. He reports that he is working on compromise. He has been doing projects on his days off to make the house better. He reports that he is not longer feeling that he has to clean up after family immediately. Denies panic attacks. Denies excessive worry.  ? ?He reports that he thinks that sexual side effects were related to anxiety and this resolved after he learned that vasectomy was effective. ? ?He reports that he would like to be more  relaxed. He reports that he continues to get agitated and irritated and this is unchanged. He reports that irritation can occur suddenly and "most of the time it's really not that big of a deal." He reports that irritation is mostly around clutter or when things are unorganized. Denies sad mood. He reports that irritation at work is "the [employer] well. Energy and motivation have been ok. Concentration has been adequate. Appetite has been ok. Denies SI.  ? ? ?Past Psychiatric Medication Trials: ?Seroquel- Reports vivid dreams and nightmares with perceptual disturbances. Helped stabilize mood. ?Lexapro- Wt gain, sexual side effects. Helped with anxiety. Reports initially felt very calm, "like a zombie." ?Paxil- Ineffective  ?Prozac- Was effective and then stopped becoming effective after 9 months. Took 60 mg.  ?Sertraline- Effective. Initially caused some sleep disturbance. Reports that it did not seem to be as effective over time.  ?Luvox- Sexual side effects ?Viibryd- Effective, cost prohibitive. He reports that Viibryd seemed to be less effective over time. ?Buspar- ineffective ?Trazodone ?Depakote- wt gain ? ? ?Review of Systems:  ?Review of Systems  ?Musculoskeletal:  Negative for gait problem.  ?Skin:  Negative for rash.  ?Psychiatric/Behavioral:    ?     Please refer to HPI  ? ?Medications: I have reviewed the patient's current medications. ? ?Current Outpatient Medications  ?Medication Sig Dispense Refill  ? atorvastatin (LIPITOR) 40 MG tablet Take 40 mg by mouth daily.    ? ergocalciferol (VITAMIN D2) 1.25 MG (50000  UT) capsule Vitamin D2 1,250 mcg (50,000 unit) capsule ? Take 1 capsule every week by oral route.    ? valACYclovir (VALTREX) 1000 MG tablet Take 1,000 mg by mouth daily as needed.    ? fluvoxaMINE (LUVOX) 100 MG tablet Take 1 tablet in the morning and 1.5 tabs at bedtime for one week, then increase to 1.5 tablets twice daily 270 tablet 0  ? lamoTRIgine (LAMICTAL) 200 MG tablet Take 1  tablet (200 mg total) by mouth daily. 30 tablet 1  ? nitroGLYCERIN (NITROSTAT) 0.4 MG SL tablet Place 1 tablet (0.4 mg total) under the tongue every 5 (five) minutes as needed for chest pain. 30 tablet 3  ? ?No current facility-administered medications for this visit.  ? ? ?Medication Side Effects: None ? ?Allergies:  ?Allergies  ?Allergen Reactions  ? Doxycycline   ?  Severe headache  ? Penicillins Rash  ? ? ?Past Medical History:  ?Diagnosis Date  ? Anxiety   ? Elevated cholesterol   ? Vitamin D deficiency   ? ? ?Family History  ?Problem Relation Age of Onset  ? Anxiety disorder Mother   ? Depression Mother   ? Alcohol abuse Paternal Grandfather   ? Heart attack Paternal Grandfather   ? Anxiety disorder Maternal Aunt   ? Anxiety disorder Maternal Uncle   ? ? ?Social History  ? ?Socioeconomic History  ? Marital status: Married  ?  Spouse name: Not on file  ? Number of children: Not on file  ? Years of education: Not on file  ? Highest education level: Not on file  ?Occupational History  ? Not on file  ?Tobacco Use  ? Smoking status: Never  ? Smokeless tobacco: Never  ?Vaping Use  ? Vaping Use: Never used  ?Substance and Sexual Activity  ? Alcohol use: Yes  ?  Comment: Occasional social ETOH use on the weekends  ? Drug use: No  ? Sexual activity: Not on file  ?Other Topics Concern  ? Not on file  ?Social History Narrative  ? ** Merged History Encounter **  ?    ? ?Social Determinants of Health  ? ?Financial Resource Strain: Not on file  ?Food Insecurity: Not on file  ?Transportation Needs: Not on file  ?Physical Activity: Not on file  ?Stress: Not on file  ?Social Connections: Not on file  ?Intimate Partner Violence: Not on file  ? ? ?Past Medical History, Surgical history, Social history, and Family history were reviewed and updated as appropriate.  ? ?Please see review of systems for further details on the patient's review from today.  ? ?Objective:  ? ?Physical Exam:  ?There were no vitals taken for this  visit. ? ?Physical Exam ?Neurological:  ?   Mental Status: He is alert and oriented to person, place, and time.  ?   Cranial Nerves: No dysarthria.  ?Psychiatric:     ?   Attention and Perception: Attention and perception normal.     ?   Mood and Affect: Mood is anxious.     ?   Speech: Speech normal.     ?   Behavior: Behavior is cooperative.     ?   Thought Content: Thought content normal. Thought content is not paranoid or delusional. Thought content does not include homicidal or suicidal ideation. Thought content does not include homicidal or suicidal plan.     ?   Cognition and Memory: Cognition and memory normal.     ?   Judgment: Judgment  normal.  ?   Comments: Insight intact  ? ? ?Lab Review:  ?   ?Component Value Date/Time  ? NA 142 09/16/2019 1624  ? K 4.8 09/16/2019 1624  ? CL 104 09/16/2019 1624  ? CO2 27 09/16/2019 1624  ? GLUCOSE 91 09/16/2019 1624  ? GLUCOSE 92 05/05/2012 1528  ? BUN 16 09/16/2019 1624  ? CREATININE 0.88 09/16/2019 1624  ? CALCIUM 9.7 09/16/2019 1624  ? GFRNONAA 107 09/16/2019 1624  ? GFRAA 124 09/16/2019 1624  ? ? ?   ?Component Value Date/Time  ? WBC 5.6 05/05/2012 1528  ? RBC 4.51 05/05/2012 1528  ? HGB 13.7 05/05/2012 1528  ? HCT 39.3 05/05/2012 1528  ? PLT 180 05/05/2012 1528  ? MCV 87.1 05/05/2012 1528  ? MCH 30.4 05/05/2012 1528  ? MCHC 34.9 05/05/2012 1528  ? RDW 12.8 05/05/2012 1528  ? ? ?No results found for: POCLITH, LITHIUM  ? ?No results found for: PHENYTOIN, PHENOBARB, VALPROATE, CBMZ  ? ?.res ?Assessment: Plan:   ? ?Discussed treatment options for OCD s/s to include potential benefits, risks , and side effects of increasing Luvox and Lamictal. Pt reports that he prefers to increase both Luvox and Lamictal.  ?Will increase Lamictal to 200 mg po qd to possibly improve anxiety. Discussed that Lamictal is used for mood stabilization and that some studies indicate lamictal 200 mg daily may be helpful for OCD and this is an off-label indication.  ?Will increase Luvox to 100  mg in the morning and 150 mg at bedtime for one week, then increase to 150 mg twice daily for OCD.  ?Discussed potential benefits of therapy for OCD and possible therapy referral. ?Pt to follow-up in 6 weeks or sooner i

## 2021-06-08 ENCOUNTER — Ambulatory Visit: Payer: BC Managed Care – PPO | Admitting: Psychiatry

## 2021-07-07 ENCOUNTER — Other Ambulatory Visit: Payer: Self-pay | Admitting: Psychiatry

## 2021-07-07 DIAGNOSIS — F411 Generalized anxiety disorder: Secondary | ICD-10-CM

## 2021-07-07 DIAGNOSIS — F422 Mixed obsessional thoughts and acts: Secondary | ICD-10-CM

## 2021-07-07 NOTE — Telephone Encounter (Signed)
Pt has apt 7/27. Requesting new Rx on Lamotrigine @ [facility]

## 2021-08-03 ENCOUNTER — Telehealth (INDEPENDENT_AMBULATORY_CARE_PROVIDER_SITE_OTHER): Payer: BC Managed Care – PPO | Admitting: Psychiatry

## 2021-08-03 ENCOUNTER — Encounter: Payer: Self-pay | Admitting: Psychiatry

## 2021-08-03 DIAGNOSIS — F422 Mixed obsessional thoughts and acts: Secondary | ICD-10-CM

## 2021-08-03 DIAGNOSIS — F411 Generalized anxiety disorder: Secondary | ICD-10-CM | POA: Diagnosis not present

## 2021-08-03 MED ORDER — LAMOTRIGINE 150 MG PO TABS: 150.0000 mg | ORAL_TABLET | Freq: Two times a day (BID) | ORAL | 1 refills | Status: DC

## 2021-08-03 MED ORDER — FLUVOXAMINE MALEATE 100 MG PO TABS: 150.0000 mg | ORAL_TABLET | Freq: Two times a day (BID) | ORAL | 0 refills | Status: DC

## 2021-08-03 NOTE — Progress Notes (Signed)
Adam Herrera [id number] June 18, 1980 41 y.o.  Virtual Visit via Video Note  I connected with pt @ on 08/03/21 at  3:30 PM EDT by a video enabled telemedicine application and verified that I am speaking with the correct person using two identifiers.   I discussed the limitations of evaluation and management by telemedicine and the availability of in person appointments. The patient expressed understanding and agreed to proceed.  I discussed the assessment and treatment plan with the patient. The patient was provided an opportunity to ask questions and all were answered. The patient agreed with the plan and demonstrated an understanding of the instructions.   The patient was advised to call back or seek an in-person evaluation if the symptoms worsen or if the condition fails to improve as anticipated.  I provided 16 minutes of non-face-to-face time during this encounter.  The patient was located at home.  The provider was located at home.   Corie Chiquito, PMHNP   Subjective:   Patient ID:  Adam Herrera is a 41 y.o. (DOB 1980-03-01) male.  Chief Complaint:  Chief Complaint  Patient presents with   Follow-up    Anxiety, irritability    HPI Adam Herrera presents for follow-up of anxiety and mood disturbance. He reports that medication seems to be more effective for OCD s/s. He reports that he is less irritated in response to OCD triggers. He continues to have some obsessive thoughts and some compulsions and these are easier to manage. Denies depressed mood. Sleeping well. Energy and motivation have been somewhat improved.  Concentration has been ok. Appetite has been ok. Denies SI.   He reports that he is not as tired and no longer needing to take a nap after work.   Review of Systems:  Review of Systems  Cardiovascular:  Negative for chest pain.  Gastrointestinal: Negative.   Musculoskeletal:  Negative for gait problem.  Neurological:  Negative for tremors.   Psychiatric/Behavioral:         Please refer to HPI    Medications: I have reviewed the patient's current medications.  Current Outpatient Medications  Medication Sig Dispense Refill   atorvastatin (LIPITOR) 40 MG tablet Take 40 mg by mouth daily.     ergocalciferol (VITAMIN D2) 1.25 MG (50000 UT) capsule Vitamin D2 1,250 mcg (50,000 unit) capsule  Take 1 capsule every week by oral route.     fluvoxaMINE (LUVOX) 100 MG tablet Take 1.5 tablets (150 mg total) by mouth 2 (two) times daily. 180 tablet 0   lamoTRIgine (LAMICTAL) 150 MG tablet Take 1 tablet (150 mg total) by mouth 2 (two) times daily. 180 tablet 1   nitroGLYCERIN (NITROSTAT) 0.4 MG SL tablet Place 1 tablet (0.4 mg total) under the tongue every 5 (five) minutes as needed for chest pain. 30 tablet 3   valACYclovir (VALTREX) 1000 MG tablet Take 1,000 mg by mouth daily as needed.     No current facility-administered medications for this visit.    Medication Side Effects: None  Allergies:  Allergies  Allergen Reactions   Doxycycline     Severe headache   Penicillins Rash    Past Medical History:  Diagnosis Date   Anxiety    Elevated cholesterol    Vitamin D deficiency     Family History  Problem Relation Age of Onset   Anxiety disorder Mother    Depression Mother    Alcohol abuse Paternal Grandfather    Heart attack Paternal Grandfather    Anxiety  disorder Maternal Aunt    Anxiety disorder Maternal Uncle     Social History   Socioeconomic History   Marital status: Married    Spouse name: Not on file   Number of children: Not on file   Years of education: Not on file   Highest education level: Not on file  Occupational History   Not on file  Tobacco Use   Smoking status: Never   Smokeless tobacco: Never  Vaping Use   Vaping Use: Never used  Substance and Sexual Activity   Alcohol use: Yes    Comment: Occasional social ETOH use on the weekends   Drug use: No   Sexual activity: Not on file  Other  Topics Concern   Not on file  Social History Narrative   ** Merged History Encounter **       Social Determinants of Health   Financial Resource Strain: Not on file  Food Insecurity: Not on file  Transportation Needs: Not on file  Physical Activity: Not on file  Stress: Not on file  Social Connections: Not on file  Intimate Partner Violence: Not on file    Past Medical History, Surgical history, Social history, and Family history were reviewed and updated as appropriate.   Please see review of systems for further details on the patient's review from today.   Objective:   Physical Exam:  There were no vitals taken for this visit.  Physical Exam Neurological:     Mental Status: He is alert and oriented to person, place, and time.     Cranial Nerves: No dysarthria.  Psychiatric:        Attention and Perception: Attention and perception normal.        Mood and Affect: Mood is not depressed.        Speech: Speech normal.        Behavior: Behavior is cooperative.        Thought Content: Thought content normal. Thought content is not paranoid or delusional. Thought content does not include homicidal or suicidal ideation. Thought content does not include homicidal or suicidal plan.        Cognition and Memory: Cognition and memory normal.        Judgment: Judgment normal.     Comments: Insight intact Mood is less anxious     Lab Review:     Component Value Date/Time   NA 142 09/16/2019 1624   K 4.8 09/16/2019 1624   CL 104 09/16/2019 1624   CO2 27 09/16/2019 1624   GLUCOSE 91 09/16/2019 1624   GLUCOSE 92 05/05/2012 1528   BUN 16 09/16/2019 1624   CREATININE 0.88 09/16/2019 1624   CALCIUM 9.7 09/16/2019 1624   GFRNONAA 107 09/16/2019 1624   GFRAA 124 09/16/2019 1624       Component Value Date/Time   WBC 5.6 05/05/2012 1528   RBC 4.51 05/05/2012 1528   HGB 13.7 05/05/2012 1528   HCT 39.3 05/05/2012 1528   PLT 180 05/05/2012 1528   MCV 87.1 05/05/2012 1528    MCH 30.4 05/05/2012 1528   MCHC 34.9 05/05/2012 1528   RDW 12.8 05/05/2012 1528    No results found for: "POCLITH", "LITHIUM"   No results found for: "PHENYTOIN", "PHENOBARB", "VALPROATE", "CBMZ"   .res Assessment: Plan:    Pt seen for 16 minutes and time spent discussing response to increases in Lamictal and Luvox. He reports that he has noticed some improvement in anxiety and irritability with med changes and asks  if it would be possible to increase Lamictal dose further. Discussed that Lamictal could be increased to a total dose of 300 mg daily. Discussed that cognitive side effects can sometimes occur with higher doses of Lamictal and to contact office if this occurs. Will change Lamictal to 150 mg po BID for mood symptoms.  Continue Luvox 150 mg po BID for anxiety.  Pt to follow-up in 3 months or sooner if clinically indicated.  Patient advised to contact office with any questions, adverse effects, or acute worsening in signs and symptoms.   Adam Herrera was seen today for follow-up.  Diagnoses and all orders for this visit:  Generalized anxiety disorder -     fluvoxaMINE (LUVOX) 100 MG tablet; Take 1.5 tablets (150 mg total) by mouth 2 (two) times daily. -     lamoTRIgine (LAMICTAL) 150 MG tablet; Take 1 tablet (150 mg total) by mouth 2 (two) times daily.  Mixed obsessional thoughts and acts -     fluvoxaMINE (LUVOX) 100 MG tablet; Take 1.5 tablets (150 mg total) by mouth 2 (two) times daily. -     lamoTRIgine (LAMICTAL) 150 MG tablet; Take 1 tablet (150 mg total) by mouth 2 (two) times daily.     Please see After Visit Summary for patient specific instructions.  No future appointments.  No orders of the defined types were placed in this encounter.     -------------------------------

## 2021-09-25 ENCOUNTER — Telehealth: Payer: Self-pay | Admitting: Psychiatry

## 2021-09-25 DIAGNOSIS — F422 Mixed obsessional thoughts and acts: Secondary | ICD-10-CM

## 2021-09-25 DIAGNOSIS — F411 Generalized anxiety disorder: Secondary | ICD-10-CM

## 2021-09-25 MED ORDER — FLUVOXAMINE MALEATE 100 MG PO TABS: 150.0000 mg | ORAL_TABLET | Freq: Two times a day (BID) | ORAL | 0 refills | Status: DC

## 2021-09-25 NOTE — Telephone Encounter (Signed)
Pt called and said that he needs a refill on his fluvoxamine 100 mg. The pharmacy is [facility] at [address] in [place]

## 2021-09-25 NOTE — Telephone Encounter (Deleted)
Please call patient to schedule an appt.  Refill sent, but due for appt next month.

## 2021-11-08 ENCOUNTER — Telehealth (INDEPENDENT_AMBULATORY_CARE_PROVIDER_SITE_OTHER): Payer: BC Managed Care – PPO | Admitting: Psychiatry

## 2021-11-08 ENCOUNTER — Encounter: Payer: Self-pay | Admitting: Psychiatry

## 2021-11-08 DIAGNOSIS — F411 Generalized anxiety disorder: Secondary | ICD-10-CM

## 2021-11-08 DIAGNOSIS — F422 Mixed obsessional thoughts and acts: Secondary | ICD-10-CM

## 2021-11-08 MED ORDER — LAMOTRIGINE 150 MG PO TABS: 150.0000 mg | ORAL_TABLET | Freq: Two times a day (BID) | ORAL | 2 refills | Status: DC

## 2021-11-08 MED ORDER — FLUVOXAMINE MALEATE 100 MG PO TABS: 150.0000 mg | ORAL_TABLET | Freq: Two times a day (BID) | ORAL | 2 refills | Status: DC

## 2021-11-08 NOTE — Progress Notes (Signed)
DUVALL GLISAN [id number] [birth year] 41 y.o.  Virtual Visit via Video Note  I connected with pt @ on 11/08/21 at 11:00 AM EDT by a video enabled telemedicine application and verified that I am speaking with the correct person using two identifiers.   I discussed the limitations of evaluation and management by telemedicine and the availability of in person appointments. The patient expressed understanding and agreed to proceed.  I discussed the assessment and treatment plan with the patient. The patient was provided an opportunity to ask questions and all were answered. The patient agreed with the plan and demonstrated an understanding of the instructions.   The patient was advised to call back or seek an in-person evaluation if the symptoms worsen or if the condition fails to improve as anticipated.  I provided 15 minutes of non-face-to-face time during this encounter.  The patient was located in his personal vehicle in [place], [place].  The provider was located at [place].   Thayer Headings, PMHNP   Subjective:   Patient ID:  Adam Herrera is a 41 y.o. (DOB 07-04-1980) male.  Chief Complaint: No chief complaint on file.   HPI Adam Herrera presents for follow-up of anxiety and irritability. "I think it's good now" in terms of medications. He reports that he continues to have some obsessive thoughts and compulsions- "I don't think it bothers me as much." He reports that he has not been telling family to clean up as often and now "I don't get mad about it." He is able to wait before acting on compulsions. He denies any difficulties with irritability. Denies depressed mood. Denies panic attacks. Sleeping well. Appetite has been ok. Energy and motivation have been ok. Concentration has been ok. Denies SI.   Past Psychiatric Medication Trials: Seroquel- Reports vivid dreams and nightmares with perceptual disturbances. Helped stabilize mood. Lexapro- Wt gain,  sexual side effects. Helped with anxiety. Reports initially felt very calm, "like a zombie." Paxil- Ineffective  Prozac- Was effective and then stopped becoming effective after 9 months. Took 60 mg.  Sertraline- Effective. Initially caused some sleep disturbance. Reports that it did not seem to be as effective over time.  Luvox- Sexual side effects Viibryd- Effective, cost prohibitive. He reports that Viibryd seemed to be less effective over time. Buspar- ineffective Trazodone Depakote- wt gain    Review of Systems:  Review of Systems  Constitutional:        Night sweats  Eyes:        Occ notices a "flash or something blurry" in his peripheral vision and this will resolve when he turns his head.   Cardiovascular:  Negative for chest pain and palpitations.  Musculoskeletal:  Negative for gait problem.  Skin:  Negative for rash.  Neurological:  Negative for tremors and headaches.  Psychiatric/Behavioral:         Please refer to HPI    Medications: I have reviewed the patient's current medications.  Current Outpatient Medications  Medication Sig Dispense Refill   atorvastatin (LIPITOR) 40 MG tablet Take 40 mg by mouth daily.     ergocalciferol (VITAMIN D2) 1.25 MG (50000 UT) capsule Vitamin D2 1,250 mcg (50,000 unit) capsule  Take 1 capsule every week by oral route.     fluvoxaMINE (LUVOX) 100 MG tablet Take 1.5 tablets (150 mg total) by mouth 2 (two) times daily. 270 tablet 2   lamoTRIgine (LAMICTAL) 150 MG tablet Take 1 tablet (150 mg total) by mouth 2 (two) times daily. [place]  tablet 2   nitroGLYCERIN (NITROSTAT) 0.4 MG SL tablet Place 1 tablet (0.4 mg total) under the tongue every 5 (five) minutes as needed for chest pain. 30 tablet 3   valACYclovir (VALTREX) 1000 MG tablet Take 1,000 mg by mouth daily as needed.     No current facility-administered medications for this visit.    Medication Side Effects: Other: Night sweats   Occ noticing something blurry or a flash in his  peripheral vision.   Allergies:  Allergies  Allergen Reactions   Doxycycline     Severe headache   Penicillins Rash    Past Medical History:  Diagnosis Date   Anxiety    Elevated cholesterol    Vitamin D deficiency     Family History  Problem Relation Age of Onset   Anxiety disorder Mother    Depression Mother    Alcohol abuse Paternal Grandfather    Heart attack Paternal Grandfather    Anxiety disorder Maternal Aunt    Anxiety disorder Maternal Uncle     Social History   Socioeconomic History   Marital status: Married    Spouse name: Not on file   Number of children: Not on file   Years of education: Not on file   Highest education level: Not on file  Occupational History   Not on file  Tobacco Use   Smoking status: Never   Smokeless tobacco: Never  Vaping Use   Vaping Use: Never used  Substance and Sexual Activity   Alcohol use: Yes    Comment: Occasional social ETOH use on the weekends   Drug use: No   Sexual activity: Not on file  Other Topics Concern   Not on file  Social History Narrative   ** Merged History Encounter **       Social Determinants of Health   Financial Resource Strain: Not on file  Food Insecurity: Not on file  Transportation Needs: Not on file  Physical Activity: Not on file  Stress: Not on file  Social Connections: Not on file  Intimate Partner Violence: Not on file    Past Medical History, Surgical history, Social history, and Family history were reviewed and updated as appropriate.   Please see review of systems for further details on the patient's review from today.   Objective:   Physical Exam:  Wt 194 lb (88 kg)   BMI 29.50 kg/m   Physical Exam Constitutional:      General: He is not in acute distress. Musculoskeletal:        General: No deformity.  Neurological:     Mental Status: He is alert and oriented to person, place, and time.     Coordination: Coordination normal.  Psychiatric:        Attention  and Perception: Attention and perception normal. He does not perceive auditory or visual hallucinations.        Mood and Affect: Mood normal. Mood is not anxious or depressed. Affect is not labile, blunt, angry or inappropriate.        Speech: Speech normal.        Behavior: Behavior normal.        Thought Content: Thought content normal. Thought content is not paranoid or delusional. Thought content does not include homicidal or suicidal ideation. Thought content does not include homicidal or suicidal plan.        Cognition and Memory: Cognition and memory normal.        Judgment: Judgment normal.  Comments: Insight intact     Lab Review:     Component Value Date/Time   NA 142 09/16/2019 1624   K 4.8 09/16/2019 1624   CL 104 09/16/2019 1624   CO2 27 09/16/2019 1624   GLUCOSE 91 09/16/2019 1624   GLUCOSE 92 05/05/2012 1528   BUN 16 09/16/2019 1624   CREATININE 0.88 09/16/2019 1624   CALCIUM 9.7 09/16/2019 1624   GFRNONAA 107 09/16/2019 1624   GFRAA 124 09/16/2019 1624       Component Value Date/Time   WBC 5.6 05/05/2012 1528   RBC 4.51 05/05/2012 1528   HGB 13.7 05/05/2012 1528   HCT 39.3 05/05/2012 1528   PLT 180 05/05/2012 1528   MCV 87.1 05/05/2012 1528   MCH 30.4 05/05/2012 1528   MCHC 34.9 05/05/2012 1528   RDW 12.8 05/05/2012 1528    No results found for: "POCLITH", "LITHIUM"   No results found for: "PHENYTOIN", "PHENOBARB", "VALPROATE", "CBMZ"   .res Assessment: Plan:   Will continue current plan of care since target signs and symptoms are well controlled without any tolerability issues. Continue Luvox 150 mg twice daily for anxiety.  Continue Lamictal 150 mg twice daily for mood symptoms.  Pt to follow-up in 9 months or sooner if clinically indicated.  Patient advised to contact office with any questions, adverse effects, or acute worsening in signs and symptoms.   Diagnoses and all orders for this visit:  Generalized anxiety disorder -      fluvoxaMINE (LUVOX) 100 MG tablet; Take 1.5 tablets (150 mg total) by mouth 2 (two) times daily. -     lamoTRIgine (LAMICTAL) 150 MG tablet; Take 1 tablet (150 mg total) by mouth 2 (two) times daily.  Mixed obsessional thoughts and acts -     fluvoxaMINE (LUVOX) 100 MG tablet; Take 1.5 tablets (150 mg total) by mouth 2 (two) times daily. -     lamoTRIgine (LAMICTAL) 150 MG tablet; Take 1 tablet (150 mg total) by mouth 2 (two) times daily.     Please see After Visit Summary for patient specific instructions.  No future appointments.   No orders of the defined types were placed in this encounter.     -------------------------------

## 2021-12-25 ENCOUNTER — Other Ambulatory Visit: Payer: Self-pay | Admitting: Psychiatry

## 2021-12-25 DIAGNOSIS — F422 Mixed obsessional thoughts and acts: Secondary | ICD-10-CM

## 2021-12-25 DIAGNOSIS — F411 Generalized anxiety disorder: Secondary | ICD-10-CM

## 2022-07-23 ENCOUNTER — Emergency Department (HOSPITAL_BASED_OUTPATIENT_CLINIC_OR_DEPARTMENT_OTHER): Payer: BC Managed Care – PPO | Admitting: Radiology

## 2022-07-23 ENCOUNTER — Emergency Department (HOSPITAL_BASED_OUTPATIENT_CLINIC_OR_DEPARTMENT_OTHER)
Admission: EM | Admit: 2022-07-23 | Discharge: 2022-07-23 | Disposition: A | Discharge status: Home / Self Care | Payer: BC Managed Care – PPO | Attending: Emergency Medicine | Admitting: Emergency Medicine

## 2022-07-23 ENCOUNTER — Other Ambulatory Visit: Payer: Self-pay

## 2022-07-23 ENCOUNTER — Encounter (HOSPITAL_BASED_OUTPATIENT_CLINIC_OR_DEPARTMENT_OTHER): Payer: Self-pay | Admitting: Emergency Medicine

## 2022-07-23 DIAGNOSIS — R079 Chest pain, unspecified: Secondary | ICD-10-CM | POA: Diagnosis present

## 2022-07-23 LAB — BASIC METABOLIC PANEL
Anion gap: 9 (ref 5–15)
BUN: 22 mg/dL — ABNORMAL HIGH (ref 6–20)
CO2: 31 mmol/L (ref 22–32)
Calcium: 9.8 mg/dL (ref 8.9–10.3)
Chloride: 102 mmol/L (ref 98–111)
Creatinine, Ser: 1.19 mg/dL (ref 0.61–1.24)
GFR, Estimated: >60 mL/min (ref >60)
Glucose, Bld: 128 mg/dL — ABNORMAL HIGH (ref 70–99)
Potassium: 4.2 mmol/L (ref 3.5–5.1)
Sodium: 142 mmol/L (ref 135–145)

## 2022-07-23 LAB — CBC
HCT: 42.8 % (ref 39.0–52.0)
Hemoglobin: 14.5 g/dL (ref 13.0–17.0)
MCH: 31.3 pg (ref 26.0–34.0)
MCHC: 33.9 g/dL (ref 30.0–36.0)
MCV: 92.2 fL (ref 80.0–100.0)
Platelets: 198 10*3/uL (ref 150–400)
RBC: 4.64 MIL/uL (ref 4.22–5.81)
RDW: 12.8 % (ref 11.5–15.5)
WBC: 5.7 10*3/uL (ref 4.0–10.5)
nRBC: 0 % (ref 0.0–0.2)

## 2022-07-23 LAB — TROPONIN I (HIGH SENSITIVITY)
Troponin I (High Sensitivity): <2 ng/L (ref <18)
Troponin I (High Sensitivity): <2 ng/L (ref <18)

## 2022-07-23 MED ORDER — ACETAMINOPHEN 500 MG PO TABS
1000.0000 mg | ORAL_TABLET | Freq: Once | ORAL | Status: AC
  Administered 2022-07-23: 1000 mg via ORAL
  Filled 2022-07-23: qty 2

## 2022-07-23 NOTE — ED Notes (Signed)
 Reviewed AVS/discharge instruction with patient. Time allotted for and all questions answered. Patient is agreeable for d/c and escorted to ed exit by staff.  

## 2022-07-23 NOTE — ED Triage Notes (Signed)
Chest pain into jaw started about 20 minutes ago. Took nitrox2 and ASA 325 at home. Some relief with meds. No sob, some nausea. headache

## 2022-07-23 NOTE — Discharge Instructions (Addendum)
Follow-up with a cardiologist I have given you the information for the [facility] health cardiology group.  Return to the emergency room for any new or concerning symptoms.  Make sure you are drinking plenty water take your prescribed medications as prescribed.  I recommend holding off on nitroglycerin.

## 2022-07-23 NOTE — ED Provider Notes (Signed)
[facility] EMERGENCY DEPARTMENT AT [facility] Provider Note   CSN: [id number] Arrival date & time: 07/23/22  1815     History  Chief Complaint  Patient presents with   Chest Pain    Adam Herrera is a 42 y.o. male.   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  He states that this occurred while he was sitting down he has not had any exertional worsening he does not feel short of breath did have a brief episode of nausea which has resolved he had no vomiting.  Denies any coughing up blood or lightheadedness or dizziness.  He states that he took a dose of aspirin and 1 nitroglycerin and states that his symptoms improved approximately an hour later and resolved around the time he arrived to the emergency room.  He is now chest pain-free.  No recent surgeries, hospitalization, long travel, hemoptysis, estrogen containing OCP, cancer history.  No unilateral leg swelling.  No history of PE or VTE.      Home Medications Prior to Admission medications   Medication Sig Start Date End Date Taking? Authorizing Provider  atorvastatin (LIPITOR) 40 MG tablet Take 40 mg by mouth daily. 08/16/19   [provider]  ergocalciferol (VITAMIN D2) 1.25 MG (50000 UT) capsule Vitamin D2 1,250 mcg (50,000 unit) capsule  Take 1 capsule every week by oral route.    [provider]  fluvoxaMINE (LUVOX) 100 MG tablet Take 1.5 tablets (150 mg total) by mouth 2 (two) times daily. 11/08/21 05/07/22  Corie Chiquito, PMHNP  lamoTRIgine (LAMICTAL) 150 MG tablet Take 1 tablet (150 mg total) by mouth 2 (two) times daily. 11/08/21 02/06/22  Corie Chiquito, PMHNP   nitroGLYCERIN (NITROSTAT) 0.4 MG SL tablet Place 1 tablet (0.4 mg total) under the tongue every 5 (five) minutes as needed for chest pain. 09/04/19 12/03/19  Patwardhan, Anabel Bene, MD  valACYclovir (VALTREX) 1000 MG tablet Take 1,000 mg by mouth daily as needed.    [provider]      Allergies    Doxycycline and Penicillins    Review of Systems   Review of Systems  Cardiovascular:  Positive for chest pain.    Physical Exam Updated Vital Signs BP (!) 127/91   Pulse 60   Temp 98.1 F (36.7 C) (Oral)   Resp 19   SpO2 93%  Physical Exam Vitals and nursing note reviewed.  Constitutional:      General: He is not in acute distress. HENT:     Head: Normocephalic and atraumatic.     Nose: Nose normal.  Eyes:     General: No scleral icterus. Cardiovascular:     Rate and Rhythm: Normal rate and regular rhythm.     Pulses: Normal pulses.     Heart sounds: Normal heart sounds.  Pulmonary:     Effort: Pulmonary effort is normal. No respiratory distress.     Breath sounds: No wheezing.  Abdominal:     Palpations: Abdomen is soft.     Tenderness: There is no abdominal tenderness.  Musculoskeletal:     Cervical back: Normal range of motion.     Right lower leg: No  edema.     Left lower leg: No edema.  Skin:    General: Skin is warm and dry.     Capillary Refill: Capillary refill takes less than 2 seconds.  Neurological:     Mental Status: He is alert. Mental status is at baseline.  Psychiatric:        Mood and Affect: Mood normal.        Behavior: Behavior normal.     ED Results / Procedures / Treatments   Labs (all labs ordered are listed, but only abnormal results are displayed) Labs Reviewed  BASIC METABOLIC PANEL - Abnormal; Notable for the following components:      Result Value   Glucose, Bld 128 (*)    BUN 22 (*)    All other components within normal limits  CBC  TROPONIN I (HIGH SENSITIVITY)  TROPONIN I (HIGH SENSITIVITY)    EKG EKG  Interpretation Date/Time:  Monday July 23 2022 18:29:37 EDT Ventricular Rate:  76 PR Interval:  150 QRS Duration:  74 QT Interval:  376 QTC Calculation: 423 R Axis:   65  Text Interpretation: Normal sinus rhythm with sinus arrhythmia Normal ECG No significant change since last tracing Confirmed by Gwyneth Sprout ([id number]) on 07/23/2022 6:46:35 PM  Radiology DG Chest 2 View  Result Date: 07/23/2022 CLINICAL DATA:  Chest pain EXAM: CHEST - 2 VIEW COMPARISON:  None Available. FINDINGS: 05/06/2022 IMPRESSION: No active cardiopulmonary disease. Electronically Signed   By: Minerva Fester M.D.   On: 07/23/2022 19:45    Procedures Procedures    Medications Ordered in ED Medications  acetaminophen (TYLENOL) tablet 1,000 mg (1,000 mg Oral Given 07/23/22 2022)    ED Course/ Medical Decision Making/ A&P Clinical Course as of 07/23/22 2231  Mon Jul 23, 2022  2011 No heart issues.  No vomiting (was nauseated).   6pm developed CP sternal maybe some jaw discomofort.  Occurred when he was sitting down.   Asa and nitrog - 1 hour later sx improved.  [WF]    Clinical Course User Index [WF] Gailen Shelter, [place]                             Medical Decision Making Amount and/or Complexity of Data Reviewed Labs: ordered. Radiology: ordered.  Risk OTC drugs.   This patient presents to the ED for concern of CP, this involves a number of treatment options, and is a complaint that carries with it a moderate risk of complications and morbidity. A differential diagnosis was considered for the patient's symptoms which is discussed below:   The emergent causes of chest pain include: Acute coronary syndrome, tamponade, pericarditis/myocarditis, aortic dissection, pulmonary embolism, tension pneumothorax, pneumonia, and esophageal rupture.  I do not believe the patient has an emergent cause of chest pain, other urgent/non-acute considerations include, but are not limited to: chronic angina,  aortic stenosis, cardiomyopathy, mitral valve prolapse, pulmonary hypertension, aortic insufficiency, right ventricular hypertrophy, pleuritis, bronchitis, pneumothorax, tumor, gastroesophageal reflux disease (GERD), esophageal spasm, Mallory-Weiss syndrome, peptic ulcer disease, pancreatitis, functional gastrointestinal pain, cervical or thoracic disk disease or arthritis, shoulder arthritis, costochondritis, subacromial bursitis, anxiety or panic attack, herpes zoster, breast disorders, chest wall tumors, thoracic outlet syndrome, mediastinitis.    Co morbidities: Discussed in HPI   Brief History:  Patient is a 42 year old male with past medical history significant for PDA that was repaired early in life, no heart disease otherwise, is a non-smoker, no first-degree relatives  with heart attack before age of 37, history of anxiety  Patient states that around 6 PM this evening he developed some chest pain in the sternal area as well as some jaw discomfort.  He states that this occurred while he was sitting down he has not had any exertional worsening he does not feel short of breath did have a brief episode of nausea which has resolved he had no vomiting.  Denies any coughing up blood or lightheadedness or dizziness.  He states that he took a dose of aspirin and 1 nitroglycerin and states that his symptoms improved approximately an hour later and resolved around the time he arrived to the emergency room.  He is now chest pain-free.  No recent surgeries, hospitalization, long travel, hemoptysis, estrogen containing OCP, cancer history.  No unilateral leg swelling.  No history of PE or VTE.     EMR reviewed including pt PMHx, past surgical history and past visits to ER.   See HPI for more details   Lab Tests:  I personally reviewed all laboratory work and imaging. Metabolic panel without any acute abnormality specifically kidney function within normal limits and no significant electrolyte  abnormalities. CBC without leukocytosis or significant anemia. Lipase within normal limits, troponin x 2 undetectable  Imaging Studies:  Chest x-ray unremarkable    Cardiac Monitoring:  The patient was maintained on a cardiac monitor.  I personally viewed and interpreted the cardiac monitored which showed an underlying rhythm of: NSR EKG non-ischemic   Medicines ordered:  I ordered medication including Tylenol for pain Reevaluation of the patient after these medicines showed that the patient resolved I have reviewed the patients home medicines and have made adjustments as needed   Critical Interventions:     Consults/Attending Physician      Reevaluation:  After the interventions noted above I re-evaluated patient and found that they have :resolved   Social Determinants of Health:      Problem List / ED Course:  Somewhat atypical chest pain completely resolved at this time with 1 dose of Tylenol.  Seems that his pain significantly improved upon arrival to the emergency department.  Does have a history of PTA but no history of CAD and is a non-smoker with no family history and overall quite healthy.  Will recommend follow-up with cardiology.  Return precautions discussed   Dispostion:  After consideration of the diagnostic results and the patients response to treatment, I feel that the patent would benefit from discharge home with close outpatient follow-up.  Cardiology referral placed.   Final Clinical Impression(s) / ED Diagnoses Final diagnoses:  Chest pain, unspecified type    Rx / DC Orders ED Discharge Orders          Ordered    Ambulatory referral to Cardiology        07/23/22 2211              Gailen Shelter, [place] 07/23/22 2238    Gwyneth Sprout, MD 07/25/22 0028

## 2022-07-23 NOTE — ED Notes (Signed)
Patient transported to X-ray 

## 2022-08-22 NOTE — Telephone Encounter (Signed)
done

## 2022-10-03 ENCOUNTER — Ambulatory Visit (HOSPITAL_BASED_OUTPATIENT_CLINIC_OR_DEPARTMENT_OTHER): Payer: BC Managed Care – PPO | Admitting: Cardiology

## 2022-10-25 ENCOUNTER — Telehealth (INDEPENDENT_AMBULATORY_CARE_PROVIDER_SITE_OTHER): Payer: BC Managed Care – PPO | Admitting: Psychiatry

## 2022-10-25 ENCOUNTER — Encounter: Payer: Self-pay | Admitting: Psychiatry

## 2022-10-25 ENCOUNTER — Encounter (HOSPITAL_BASED_OUTPATIENT_CLINIC_OR_DEPARTMENT_OTHER): Payer: Self-pay

## 2022-10-25 DIAGNOSIS — F422 Mixed obsessional thoughts and acts: Secondary | ICD-10-CM

## 2022-10-25 DIAGNOSIS — F411 Generalized anxiety disorder: Secondary | ICD-10-CM | POA: Diagnosis not present

## 2022-10-25 MED ORDER — LAMOTRIGINE 150 MG PO TABS
150.0000 mg | ORAL_TABLET | Freq: Two times a day (BID) | ORAL | 2 refills | Status: DC
Start: 2022-10-25 — End: 2022-12-17

## 2022-10-25 MED ORDER — FLUVOXAMINE MALEATE 100 MG PO TABS
ORAL_TABLET | ORAL | 1 refills | Status: DC
Start: 2022-10-25 — End: 2022-12-17

## 2022-10-25 NOTE — Progress Notes (Signed)
Adam Herrera [id number] [birth year] 42 y.o.  Virtual Visit via Video Note  I connected with pt @ on 10/25/22 at  2:15 PM EDT by a video enabled telemedicine application and verified that I am speaking with the correct person using two identifiers.   I discussed the limitations of evaluation and management by telemedicine and the availability of in person appointments. The patient expressed understanding and agreed to proceed.  I discussed the assessment and treatment plan with the patient. The patient was provided an opportunity to ask questions and all were answered. The patient agreed with the plan and demonstrated an understanding of the instructions.   The patient was advised to call back or seek an in-person evaluation if the symptoms worsen or if the condition fails to improve as anticipated.  I provided 25 minutes of non-face-to-face time during this encounter.  The patient was located in his personal vehicle.  The provider was located at [facility].   Corie Chiquito, PMHNP   Subjective:   Patient ID:  Adam Herrera is a 42 y.o. (DOB [birth year]) male.  Chief Complaint:  Chief Complaint  Patient presents with   Medication Problem    Hyperhidrosis, nightmares, sexual side effects    HPI Adam Herrera presents for follow-up of anxiety and mood disturbance.   He reports that he is experiencing increased sweating at night and having nightmares. He reports that he knocked over a heavy nightstand with a safe on top. He reports that nightmares are not every night. He reports that sweating has been going on awhile. He reports that he is also experiencing sexual side effects.   He denies any current anxiety or depression. Denies excessive worry. He reports that he has some compulsions to clean and straighten things- "but I don't get mad about it." He reports "some mood swings... it is better." He reports that he will occasionally notice increased anger and  a "burst of energy." He reports symptoms vary in duration. Denies excessive spending or impulsive behavior. Denies elevated mood. Denies increased talkativeness. Denies difficulty falling asleep. He reports that he wakes up at 3:40 am for work. Sleeps 6-7 hours a night. Appetite is stablet. Denies difficulty with concentration. Denies SI.   He reports that work is going ok. He denies any acute stressors.   Past Psychiatric Medication Trials: Seroquel- Reports vivid dreams and nightmares with perceptual disturbances. Helped stabilize mood. Lexapro- Wt gain, sexual side effects. Helped with anxiety. Reports initially felt very calm, "like a zombie." Paxil- Ineffective  Prozac- Was effective and then stopped becoming effective after 9 months. Took 60 mg.  Sertraline- Effective. Initially caused some sleep disturbance. Reports that it did not seem to be as effective over time.  Luvox- Sexual side effects Viibryd- Effective, cost prohibitive. He reports that Viibryd seemed to be less effective over time. Buspar- ineffective Trazodone Depakote- wt gain  Review of Systems:  Review of Systems  Respiratory:  Negative for chest tightness.   Musculoskeletal:  Negative for gait problem.  Neurological:  Negative for tremors.  Psychiatric/Behavioral:         Please refer to HPI    Medications: I have reviewed the patient's current medications.  Current Outpatient Medications  Medication Sig Dispense Refill   atorvastatin (LIPITOR) 40 MG tablet Take 40 mg by mouth daily.     fluvoxaMINE (LUVOX) 100 MG tablet Take 1.5 tablets in the morning and 1 tablet at bedtime. May decrease to 1.5 tablets in the morning and 1/2 tablet  at bedtime after 2 weeks if continuing to experience side effects. 225 tablet 1   lamoTRIgine (LAMICTAL) 150 MG tablet Take 1 tablet (150 mg total) by mouth 2 (two) times daily. 180 tablet 2   nitroGLYCERIN (NITROSTAT) 0.4 MG SL tablet Place 1 tablet (0.4 mg total) under the tongue  every 5 (five) minutes as needed for chest pain. 30 tablet 3   valACYclovir (VALTREX) 1000 MG tablet Take 1,000 mg by mouth daily as needed.     No current facility-administered medications for this visit.    Medication Side Effects: Other: Sweating, nightmares, sexual side effects.   Allergies:  Allergies  Allergen Reactions   Doxycycline     Severe headache   Penicillins Rash    Past Medical History:  Diagnosis Date   Anxiety    Elevated cholesterol    Vitamin D deficiency     Family History  Problem Relation Age of Onset   Anxiety disorder Mother    Depression Mother    Alcohol abuse Paternal Grandfather    Heart attack Paternal Grandfather    Anxiety disorder Maternal Aunt    Anxiety disorder Maternal Uncle     Social History   Socioeconomic History   Marital status: Married    Spouse name: Not on file   Number of children: Not on file   Years of education: Not on file   Highest education level: Not on file  Occupational History   Not on file  Tobacco Use   Smoking status: Never   Smokeless tobacco: Never  Vaping Use   Vaping status: Never Used  Substance and Sexual Activity   Alcohol use: Yes    Comment: Occasional social ETOH use on the weekends   Drug use: No   Sexual activity: Not on file  Other Topics Concern   Not on file  Social History Narrative   ** Merged History Encounter **       Social Determinants of Health   Financial Resource Strain: Not on file  Food Insecurity: Low Risk  (08/15/2022)   Received from [facility]   Hunger Vital Sign    Worried About Running Out of Food in the Last Year: Never true    Ran Out of Food in the Last Year: Never true  Transportation Needs: Not on file (08/15/2022)  Physical Activity: Not on file  Stress: Not on file  Social Connections: Not on file  Intimate Partner Violence: Not on file    Past Medical History, Surgical history, Social history, and Family history were reviewed and updated as  appropriate.   Please see review of systems for further details on the patient's review from today.   Objective:   Physical Exam:  There were no vitals taken for this visit.  Physical Exam Neurological:     Mental Status: He is alert and oriented to person, place, and time.     Cranial Nerves: No dysarthria.  Psychiatric:        Attention and Perception: Attention and perception normal.        Mood and Affect: Mood is not depressed.        Speech: Speech normal.        Behavior: Behavior is cooperative.        Thought Content: Thought content normal. Thought content is not paranoid or delusional. Thought content does not include homicidal or suicidal ideation. Thought content does not include homicidal or suicidal plan.        Cognition and  Memory: Cognition and memory normal.        Judgment: Judgment normal.     Comments: Insight intact Mood is mildly anxious     Lab Review:     Component Value Date/Time   NA 142 07/23/2022 1834   NA 142 09/16/2019 1624   K 4.2 07/23/2022 1834   CL 102 07/23/2022 1834   CO2 31 07/23/2022 1834   GLUCOSE 128 (H) 07/23/2022 1834   BUN 22 (H) 07/23/2022 1834   BUN 16 09/16/2019 1624   CREATININE 1.19 07/23/2022 1834   CALCIUM 9.8 07/23/2022 1834   GFRNONAA >60 07/23/2022 1834   GFRAA 124 09/16/2019 1624       Component Value Date/Time   WBC 5.7 07/23/2022 1834   RBC 4.64 07/23/2022 1834   HGB 14.5 07/23/2022 1834   HCT 42.8 07/23/2022 1834   PLT 198 07/23/2022 1834   MCV 92.2 07/23/2022 1834   MCH 31.3 07/23/2022 1834   MCHC 33.9 07/23/2022 1834   RDW 12.8 07/23/2022 1834    No results found for: "POCLITH", "LITHIUM"   No results found for: "PHENYTOIN", "PHENOBARB", "VALPROATE", "CBMZ"   .res Assessment: Plan:   29 minutes spent dedicated to the care of this patient on the date of this encounter to include pre-visit review of records, ordering of medication, post visit documentation, and face-to-face time with the patient  discussing side effects and that reported side effects are typically associated more with Luvox than Lamotrigine. Discussed that dose reduction may help reduce symptoms. Pt is in agreement with dose reduction attempt. Will decrease Luvox from 150 mg twice daily to 150 mg in the morning and 100 mg at bedtime. Discussed that after 2 weeks he could further reduce Luvox to 150 mg in the morning and 50 mg at bedtime if side effects have improved somewhat but are continuing to be bothersome. Recommended reducing night-time dose since this dose is most likely to impact nightmares and night sweats.  Will continue Lamictal 150 mg twice daily for mood symptoms.  Pt to follow-up with this provider in 6-8 weeks or sooner if clinically indicated.  Patient advised to contact office with any questions, adverse effects, or acute worsening in signs and symptoms.   Adam Herrera was seen today for medication problem.  Diagnoses and all orders for this visit:  Generalized anxiety disorder -     fluvoxaMINE (LUVOX) 100 MG tablet; Take 1.5 tablets in the morning and 1 tablet at bedtime. May decrease to 1.5 tablets in the morning and 1/2 tablet at bedtime after 2 weeks if continuing to experience side effects. -     lamoTRIgine (LAMICTAL) 150 MG tablet; Take 1 tablet (150 mg total) by mouth 2 (two) times daily.  Mixed obsessional thoughts and acts -     fluvoxaMINE (LUVOX) 100 MG tablet; Take 1.5 tablets in the morning and 1 tablet at bedtime. May decrease to 1.5 tablets in the morning and 1/2 tablet at bedtime after 2 weeks if continuing to experience side effects. -     lamoTRIgine (LAMICTAL) 150 MG tablet; Take 1 tablet (150 mg total) by mouth 2 (two) times daily.     Please see After Visit Summary for patient specific instructions.  No future appointments.  No orders of the defined types were placed in this encounter.     -------------------------------

## 2022-11-21 ENCOUNTER — Encounter: Payer: Self-pay | Admitting: Psychiatry

## 2022-12-17 ENCOUNTER — Encounter: Payer: Self-pay | Admitting: Psychiatry

## 2022-12-17 ENCOUNTER — Telehealth (INDEPENDENT_AMBULATORY_CARE_PROVIDER_SITE_OTHER): Payer: BC Managed Care – PPO | Admitting: Psychiatry

## 2022-12-17 DIAGNOSIS — F411 Generalized anxiety disorder: Secondary | ICD-10-CM | POA: Diagnosis not present

## 2022-12-17 DIAGNOSIS — F422 Mixed obsessional thoughts and acts: Secondary | ICD-10-CM | POA: Diagnosis not present

## 2022-12-17 MED ORDER — LAMOTRIGINE 150 MG PO TABS
150.0000 mg | ORAL_TABLET | Freq: Two times a day (BID) | ORAL | 1 refills | Status: AC
Start: 2022-12-17 — End: 2023-03-17

## 2022-12-17 NOTE — Progress Notes (Unsigned)
Adam Herrera [id number] 01-27-1980 42 y.o.  Virtual Visit via Video Note  I connected with pt @ on 12/17/22 at 10:00 AM EST by a video enabled telemedicine application and verified that I am speaking with the correct person using two identifiers.   I discussed the limitations of evaluation and management by telemedicine and the availability of in person appointments. The patient expressed understanding and agreed to proceed.  I discussed the assessment and treatment plan with the patient. The patient was provided an opportunity to ask questions and all were answered. The patient agreed with the plan and demonstrated an understanding of the instructions.   The patient was advised to call back or seek an in-person evaluation if the symptoms worsen or if the condition fails to improve as anticipated.  I provided 25 minutes of non-face-to-face time during this encounter.  The patient was located in his personal vehicle outside of work.  The provider was located at [facility].   Corie Chiquito, PMHNP   Subjective:   Patient ID:  Adam Herrera is a 42 y.o. (DOB Dec 25, 1980) male.  Chief Complaint:  Chief Complaint  Patient presents with   Follow-up    Anxiety    HPI Adam Herrera presents for follow-up of anxiety and mood disturbance. He reports that he reduced Luvox to help with night sweats and this "was not helping at all." He reports that he continued to gradually reduce Luvox until it was completed. He is currently not sweating. Denies any increased anxiety. He reports  that he has not noticed any "controlling" OCD symptoms. Denies depressed mood or irritability. He reports that sleep has improved with resolution of sweating. Energy and motivation have been good. He reports adequate concentration. Appetite has been ok. Denies SI.   He reports that he has been coping with work stress. Family has been doing ok.   They have a 43 yo and a 43 yo.   Past  Psychiatric Medication Trials: Seroquel- Reports vivid dreams and nightmares with perceptual disturbances. Helped stabilize mood. Lexapro- Wt gain, sexual side effects. Helped with anxiety. Reports initially felt very calm, "like a zombie." Paxil- Ineffective  Prozac- Was effective and then stopped becoming effective after 9 months. Took 60 mg.  Sertraline- Effective. Initially caused some sleep disturbance. Reports that it did not seem to be as effective over time.  Luvox- Sexual side effects. Excessive sweating.  Viibryd- Effective, cost prohibitive. He reports that Viibryd seemed to be less effective over time. Buspar- ineffective Trazodone Depakote- wt gain  Review of Systems:  Review of Systems  Constitutional:  Negative for diaphoresis.  Musculoskeletal:  Negative for gait problem.  Skin:  Negative for rash.  Neurological:  Negative for tremors.  Psychiatric/Behavioral:         Please refer to HPI    Medications: I have reviewed the patient's current medications.  Current Outpatient Medications  Medication Sig Dispense Refill   atorvastatin (LIPITOR) 40 MG tablet Take 40 mg by mouth daily.     Vitamin D, Ergocalciferol, (DRISDOL) 1.25 MG (50000 UNIT) CAPS capsule Take 50,000 Units by mouth once a week.     lamoTRIgine (LAMICTAL) 150 MG tablet Take 1 tablet (150 mg total) by mouth 2 (two) times daily. 180 tablet 1   nitroGLYCERIN (NITROSTAT) 0.4 MG SL tablet Place 1 tablet (0.4 mg total) under the tongue every 5 (five) minutes as needed for chest pain. 30 tablet 3   valACYclovir (VALTREX) 1000 MG tablet Take 1,000 mg by  mouth daily as needed.     No current facility-administered medications for this visit.    Medication Side Effects: None  Allergies:  Allergies  Allergen Reactions   Doxycycline     Severe headache   Penicillins Rash    Past Medical History:  Diagnosis Date   Anxiety    Elevated cholesterol    Vitamin D deficiency     Family History  Problem  Relation Age of Onset   Anxiety disorder Mother    Depression Mother    Alcohol abuse Paternal Grandfather    Heart attack Paternal Grandfather    Anxiety disorder Maternal Aunt    Anxiety disorder Maternal Uncle     Social History   Socioeconomic History   Marital status: Married    Spouse name: Not on file   Number of children: Not on file   Years of education: Not on file   Highest education level: Not on file  Occupational History   Not on file  Tobacco Use   Smoking status: Never   Smokeless tobacco: Never  Vaping Use   Vaping status: Never Used  Substance and Sexual Activity   Alcohol use: Yes    Comment: Occasional social ETOH use on the weekends   Drug use: No   Sexual activity: Not on file  Other Topics Concern   Not on file  Social History Narrative   ** Merged History Encounter **       Social Determinants of Health   Financial Resource Strain: Not on file  Food Insecurity: Low Risk  (11/28/2022)   Received from [facility]   Hunger Vital Sign    Worried About Running Out of Food in the Last Year: Never true    Ran Out of Food in the Last Year: Never true  Transportation Needs: No Transportation Needs (11/28/2022)   Received from [facility]    In the past 12 months, has lack of reliable transportation kept you from medical appointments, meetings, work or from getting things needed for daily living? : No  Physical Activity: Not on file  Stress: Not on file (11/15/2022)  Social Connections: Not on file  Intimate Partner Violence: Not on file    Past Medical History, Surgical history, Social history, and Family history were reviewed and updated as appropriate.   Please see review of systems for further details on the patient's review from today.   Objective:   Physical Exam:  There were no vitals taken for this visit.  Physical Exam Constitutional:      General: He is not in acute distress. Musculoskeletal:        General:  No deformity.  Neurological:     Mental Status: He is alert and oriented to person, place, and time.     Coordination: Coordination normal.  Psychiatric:        Attention and Perception: Attention and perception normal. He does not perceive auditory or visual hallucinations.        Mood and Affect: Mood normal. Mood is not anxious or depressed. Affect is not labile, blunt, angry or inappropriate.        Speech: Speech normal.        Behavior: Behavior normal.        Thought Content: Thought content normal. Thought content is not paranoid or delusional. Thought content does not include homicidal or suicidal ideation. Thought content does not include homicidal or suicidal plan.        Cognition  and Memory: Cognition and memory normal.        Judgment: Judgment normal.     Comments: Insight intact     Lab Review:     Component Value Date/Time   NA 142 07/23/2022 1834   NA 142 09/16/2019 1624   K 4.2 07/23/2022 1834   CL 102 07/23/2022 1834   CO2 31 07/23/2022 1834   GLUCOSE 128 (H) 07/23/2022 1834   BUN 22 (H) 07/23/2022 1834   BUN 16 09/16/2019 1624   CREATININE 1.19 07/23/2022 1834   CALCIUM 9.8 07/23/2022 1834   GFRNONAA >60 07/23/2022 1834   GFRAA 124 09/16/2019 1624       Component Value Date/Time   WBC 5.7 07/23/2022 1834   RBC 4.64 07/23/2022 1834   HGB 14.5 07/23/2022 1834   HCT 42.8 07/23/2022 1834   PLT 198 07/23/2022 1834   MCV 92.2 07/23/2022 1834   MCH 31.3 07/23/2022 1834   MCHC 33.9 07/23/2022 1834   RDW 12.8 07/23/2022 1834    No results found for: "POCLITH", "LITHIUM"   No results found for: "PHENYTOIN", "PHENOBARB", "VALPROATE", "CBMZ"   .res Assessment: Plan:    He reports that he and his wife would like to see a family therapist. Discussed possible referrals to therapists that may work with couples/families. Also recommended speaking with daughter's therapist about referrals.  He reports that he would like to continue Lamictal 150 mg BID.  Discussed that Lamictal is being used off-label for OCD and depression without Bipolar Disorder. Pt asks about possibly increasing Lamictal dose. Discussed potential benefits, risks, and side effects of higher dose and pt reports that he prefers to continue current dose.  He denies any worsening anxiety since decreasing and discontinuing Fluvoxamine due to excessive sweating. Discussed not adding any new medications at this time since anxiety symptoms are currently manageable.  Pt to follow-up in 6 months or sooner if clinically indicated.  Patient advised to contact office with any questions, adverse effects, or acute worsening in signs and symptoms.   Elic was seen today for follow-up.  Diagnoses and all orders for this visit:  Generalized anxiety disorder -     lamoTRIgine (LAMICTAL) 150 MG tablet; Take 1 tablet (150 mg total) by mouth 2 (two) times daily.  Mixed obsessional thoughts and acts -     lamoTRIgine (LAMICTAL) 150 MG tablet; Take 1 tablet (150 mg total) by mouth 2 (two) times daily.     Please see After Visit Summary for patient specific instructions.  Future Appointments  Date Time Provider Department Center  01/04/2023 10:00 AM Corie Chiquito, PMHNP [facility] None    No orders of the defined types were placed in this encounter.     -------------------------------

## 2023-01-04 ENCOUNTER — Telehealth (INDEPENDENT_AMBULATORY_CARE_PROVIDER_SITE_OTHER): Payer: BC Managed Care – PPO | Admitting: Psychiatry

## 2023-01-04 ENCOUNTER — Encounter: Payer: Self-pay | Admitting: Psychiatry

## 2023-01-04 DIAGNOSIS — F411 Generalized anxiety disorder: Secondary | ICD-10-CM

## 2023-01-04 DIAGNOSIS — F422 Mixed obsessional thoughts and acts: Secondary | ICD-10-CM

## 2023-01-04 MED ORDER — CITALOPRAM HYDROBROMIDE 20 MG PO TABS: ORAL_TABLET | ORAL | 2 refills | Status: DC

## 2023-01-04 NOTE — Progress Notes (Signed)
Adam Herrera [id number] 1980-12-09 42 y.o.  Virtual Visit via Video Note  I connected with pt @ on 01/04/23 at 10:00 AM EST by a video enabled telemedicine application and verified that I am speaking with the correct person using two identifiers.   I discussed the limitations of evaluation and management by telemedicine and the availability of in person appointments. The patient expressed understanding and agreed to proceed.  I discussed the assessment and treatment plan with the patient. The patient was provided an opportunity to ask questions and all were answered. The patient agreed with the plan and demonstrated an understanding of the instructions.   The patient was advised to call back or seek an in-person evaluation if the symptoms worsen or if the condition fails to improve as anticipated.  I provided 27 minutes of non-face-to-face time during this encounter.  The patient was located in his personal vehicle outside of work.  The provider was located at home.   Corie Chiquito, PMHNP   Subjective:   Patient ID:  Adam Herrera is a 42 y.o. (DOB 1980/06/30) male.  Chief Complaint:  Chief Complaint  Patient presents with   Anxiety    HPI Adam Herrera presents for follow-up of anxiety. e reports "the OCD is" significantly decreased. He reports, "I'm annoying everybody... I'm annoying myself" with cleaning up behind them. He reports that he feels like, "I can't control it." He reports that he has had difficulty relaxing with family because of compulsions and is getting up in the middle of a movie to clean and straighten things. He reports that at work he notices multiple things he would change.   He denies anger or irritability. Denies depression. He reports improved sleep since sweating resolved. Denies change in appetite. Energy and motivation have been ok. Difficulty with concentration due to being distracted by obsessive thoughts. Denies SI.  Past Psychiatric  Medication Trials: Seroquel- Reports vivid dreams and nightmares with perceptual disturbances. Helped stabilize mood. Lexapro- Wt gain, sexual side effects, Vivid nightmares. Helped with anxiety. Reports initially felt very calm, "like a zombie." Paxil- Ineffective  Prozac- Was effective and then stopped becoming effective after 9 months. Took 60 mg.  Sertraline- Effective. Initially caused some sleep disturbance. Reports that it did not seem to be as effective over time.  Luvox- Sexual side effects. Excessive sweating.  Viibryd- Effective, cost prohibitive. He reports that Viibryd seemed to be less effective over time. Buspar- ineffective Trazodone Depakote- wt gain  Review of Systems:  Review of Systems  Constitutional:  Negative for diaphoresis.  Musculoskeletal:  Negative for gait problem.  Psychiatric/Behavioral:         Please refer to HPI    Medications: I have reviewed the patient's current medications.  Current Outpatient Medications  Medication Sig Dispense Refill   citalopram (CELEXA) 20 MG tablet Take 1/2 tablet daily for one week, then increase to 1 tablet daily 30 tablet 2   atorvastatin (LIPITOR) 40 MG tablet Take 40 mg by mouth daily.     lamoTRIgine (LAMICTAL) 150 MG tablet Take 1 tablet (150 mg total) by mouth 2 (two) times daily. 180 tablet 1   nitroGLYCERIN (NITROSTAT) 0.4 MG SL tablet Place 1 tablet (0.4 mg total) under the tongue every 5 (five) minutes as needed for chest pain. 30 tablet 3   valACYclovir (VALTREX) 1000 MG tablet Take 1,000 mg by mouth daily as needed.     Vitamin D, Ergocalciferol, (DRISDOL) 1.25 MG (50000 UNIT) CAPS capsule Take 50,000 Units  by mouth once a week.     No current facility-administered medications for this visit.    Medication Side Effects: None  Allergies:  Allergies  Allergen Reactions   Doxycycline     Severe headache   Penicillins Rash    Past Medical History:  Diagnosis Date   Anxiety    Elevated cholesterol     Vitamin D deficiency     Family History  Problem Relation Age of Onset   Anxiety disorder Mother    Depression Mother    Alcohol abuse Paternal Grandfather    Heart attack Paternal Grandfather    Anxiety disorder Maternal Aunt    Anxiety disorder Maternal Uncle     Social History   Socioeconomic History   Marital status: Married    Spouse name: Not on file   Number of children: Not on file   Years of education: Not on file   Highest education level: Not on file  Occupational History   Not on file  Tobacco Use   Smoking status: Never   Smokeless tobacco: Never  Vaping Use   Vaping status: Never Used  Substance and Sexual Activity   Alcohol use: Yes    Comment: Occasional social ETOH use on the weekends   Drug use: No   Sexual activity: Not on file  Other Topics Concern   Not on file  Social History Narrative   ** Merged History Encounter **       Social Drivers of Health   Financial Resource Strain: Not on file  Food Insecurity: Low Risk  (11/28/2022)   Received from [facility]   Hunger Vital Sign    Worried About Running Out of Food in the Last Year: Never true    Ran Out of Food in the Last Year: Never true  Transportation Needs: No Transportation Needs (11/28/2022)   Received from [facility]    In the past 12 months, has lack of reliable transportation kept you from medical appointments, meetings, work or from getting things needed for daily living? : No  Physical Activity: Not on file  Stress: Not on file (11/15/2022)  Social Connections: Not on file  Intimate Partner Violence: Not on file    Past Medical History, Surgical history, Social history, and Family history were reviewed and updated as appropriate.   Please see review of systems for further details on the patient's review from today.   Objective:   Physical Exam:  There were no vitals taken for this visit.  Physical Exam Neurological:     Mental Status: He is alert  and oriented to person, place, and time.     Cranial Nerves: No dysarthria.  Psychiatric:        Attention and Perception: Attention and perception normal.        Mood and Affect: Mood is anxious.        Speech: Speech normal.        Behavior: Behavior is cooperative.        Thought Content: Thought content normal. Thought content is not paranoid or delusional. Thought content does not include homicidal or suicidal ideation. Thought content does not include homicidal or suicidal plan.        Cognition and Memory: Cognition and memory normal.        Judgment: Judgment normal.     Comments: Insight intact     Lab Review:     Component Value Date/Time   NA 142 07/23/2022 1834  NA 142 09/16/2019 1624   K 4.2 07/23/2022 1834   CL 102 07/23/2022 1834   CO2 31 07/23/2022 1834   GLUCOSE 128 (H) 07/23/2022 1834   BUN 22 (H) 07/23/2022 1834   BUN 16 09/16/2019 1624   CREATININE 1.19 07/23/2022 1834   CALCIUM 9.8 07/23/2022 1834   GFRNONAA >60 07/23/2022 1834   GFRAA 124 09/16/2019 1624       Component Value Date/Time   WBC 5.7 07/23/2022 1834   RBC 4.64 07/23/2022 1834   HGB 14.5 07/23/2022 1834   HCT 42.8 07/23/2022 1834   PLT 198 07/23/2022 1834   MCV 92.2 07/23/2022 1834   MCH 31.3 07/23/2022 1834   MCHC 33.9 07/23/2022 1834   RDW 12.8 07/23/2022 1834    No results found for: "POCLITH", "LITHIUM"   No results found for: "PHENYTOIN", "PHENOBARB", "VALPROATE", "CBMZ"   .res Assessment: Plan:   33 minutes spent dedicated to the care of this patient on the date of this encounter to include pre-visit review of records, ordering of medication, post visit documentation, and face-to-face time with the patient discussing treatment options for OCD symptoms and possible side effects since he reports experiencing side effects with multiple medications in the past. Pt agrees to trial of citalopram. Will start Citalopram 10 mg daily for one week, then increase to 20 mg daily for  anxiety.  Continue Lamictal 150 mg twice daily for mood stabilization.  Pt to follow-up in 3 months or sooner if clinically indicated.  Patient advised to contact office with any questions, adverse effects, or acute worsening in signs and symptoms.   Adam Herrera was seen today for anxiety.  Diagnoses and all orders for this visit:  Mixed obsessional thoughts and acts -     citalopram (CELEXA) 20 MG tablet; Take 1/2 tablet daily for one week, then increase to 1 tablet daily  Generalized anxiety disorder -     citalopram (CELEXA) 20 MG tablet; Take 1/2 tablet daily for one week, then increase to 1 tablet daily     Please see After Visit Summary for patient specific instructions.  No future appointments.  No orders of the defined types were placed in this encounter.     -------------------------------

## 2023-04-09 ENCOUNTER — Telehealth: Payer: Self-pay | Admitting: Psychiatry

## 2023-04-09 DIAGNOSIS — F411 Generalized anxiety disorder: Secondary | ICD-10-CM

## 2023-04-09 DIAGNOSIS — F422 Mixed obsessional thoughts and acts: Secondary | ICD-10-CM

## 2023-04-09 MED ORDER — CITALOPRAM HYDROBROMIDE 20 MG PO TABS: ORAL_TABLET | ORAL | 0 refills | Status: AC

## 2023-04-09 NOTE — Telephone Encounter (Signed)
 Pt called requesting Rx Celexa 20 mg to [facility] [address]. Pt will call to get apt with Shanda Bumps

## 2023-04-09 NOTE — Telephone Encounter (Signed)
 Sent 30-day supply of Celexa to [facility] on [address].

## 2023-11-04 ENCOUNTER — Other Ambulatory Visit: Payer: Self-pay | Admitting: Psychiatry

## 2023-11-04 DIAGNOSIS — F422 Mixed obsessional thoughts and acts: Secondary | ICD-10-CM

## 2023-11-04 DIAGNOSIS — F411 Generalized anxiety disorder: Secondary | ICD-10-CM

## 2023-11-09 ENCOUNTER — Other Ambulatory Visit: Payer: Self-pay | Admitting: Psychiatry

## 2023-11-09 DIAGNOSIS — F411 Generalized anxiety disorder: Secondary | ICD-10-CM

## 2023-11-09 DIAGNOSIS — F422 Mixed obsessional thoughts and acts: Secondary | ICD-10-CM

## 2023-11-10 NOTE — Telephone Encounter (Signed)
Needs appt for RF.

## 2023-11-11 NOTE — Telephone Encounter (Signed)
 Called pt. Lvm to call for follow up apt for future refills on meds.
